# Patient Record
Sex: Male | Born: 1985 | Race: Black or African American | Hispanic: No | Marital: Married | Smoking: Current every day smoker
Health system: Southern US, Community
[De-identification: ages and names within clinical notes are randomized; demographics above are authoritative.]

## PROBLEM LIST (undated history)

## (undated) DIAGNOSIS — W3400XA Accidental discharge from unspecified firearms or gun, initial encounter: Secondary | ICD-10-CM

## (undated) DIAGNOSIS — F4310 Post-traumatic stress disorder, unspecified: Secondary | ICD-10-CM

## (undated) HISTORY — PX: OTHER SURGICAL HISTORY: SHX169

---
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
## 2008-11-05 DIAGNOSIS — W3400XA Accidental discharge from unspecified firearms or gun, initial encounter: Secondary | ICD-10-CM

## 2008-11-05 HISTORY — DX: Accidental discharge from unspecified firearms or gun, initial encounter: W34.00XA

## 2019-05-13 ENCOUNTER — Ambulatory Visit (HOSPITAL_COMMUNITY)
Admission: EM | Admit: 2019-05-13 | Discharge: 2019-05-13 | Disposition: A | Discharge status: Home / Self Care | Payer: Self-pay

## 2019-05-13 ENCOUNTER — Emergency Department (HOSPITAL_COMMUNITY)
Admission: EM | Admit: 2019-05-13 | Discharge: 2019-05-14 | Disposition: A | Discharge status: Home / Self Care | Payer: Self-pay | Attending: Emergency Medicine | Admitting: Emergency Medicine

## 2019-05-13 ENCOUNTER — Other Ambulatory Visit: Payer: Self-pay

## 2019-05-13 ENCOUNTER — Emergency Department (HOSPITAL_COMMUNITY): Payer: Self-pay

## 2019-05-13 ENCOUNTER — Encounter (HOSPITAL_COMMUNITY): Payer: Self-pay

## 2019-05-13 DIAGNOSIS — M25472 Effusion, left ankle: Secondary | ICD-10-CM | POA: Insufficient documentation

## 2019-05-13 DIAGNOSIS — M25471 Effusion, right ankle: Secondary | ICD-10-CM | POA: Insufficient documentation

## 2019-05-13 LAB — URINALYSIS, ROUTINE W REFLEX MICROSCOPIC
Bilirubin Urine: NEGATIVE
Glucose, UA: NEGATIVE mg/dL
Hgb urine dipstick: NEGATIVE
Ketones, ur: NEGATIVE mg/dL
Leukocytes,Ua: NEGATIVE
Nitrite: NEGATIVE
Protein, ur: NEGATIVE mg/dL
Specific Gravity, Urine: 1.024 (ref 1.005–1.030)
pH: 6 (ref 5.0–8.0)

## 2019-05-13 LAB — I-STAT CHEM 8, ED
BUN: 13 mg/dL (ref 6–20)
Calcium, Ion: 1.28 mmol/L (ref 1.15–1.40)
Chloride: 107 mmol/L (ref 98–111)
Creatinine, Ser: 1 mg/dL (ref 0.61–1.24)
Glucose, Bld: 86 mg/dL (ref 70–99)
HCT: 40 % (ref 39.0–52.0)
Hemoglobin: 13.6 g/dL (ref 13.0–17.0)
Potassium: 3.8 mmol/L (ref 3.5–5.1)
Sodium: 140 mmol/L (ref 135–145)
TCO2: 29 mmol/L (ref 22–32)
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
## 2019-05-13 NOTE — ED Notes (Signed)
No answer when called for room 

## 2019-05-13 NOTE — ED Notes (Signed)
Annye English Greeter) reported patient had out of state insurance and would have to pay for visit.  Patient did not want to do that and went to ed to be evaluated.

## 2019-05-13 NOTE — Discharge Instructions (Signed)
Your lab results today were reassuring. Be sure to eat a balanced diet, reducing salt, processed foods, fast food, and sugars. Follow-up with a primary care provider for any further management of this issue. Return to the ED for chest pain, shortness of breath, one-sided leg pain with swelling, difficulty breathing when lying flat, or any other major concerns.
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
## 2019-07-08 ENCOUNTER — Other Ambulatory Visit: Payer: Self-pay

## 2019-07-08 ENCOUNTER — Emergency Department (HOSPITAL_COMMUNITY)
Admission: EM | Admit: 2019-07-08 | Discharge: 2019-07-08 | Disposition: A | Discharge status: Home / Self Care | Payer: HRSA Program | Attending: Emergency Medicine | Admitting: Emergency Medicine

## 2019-07-08 ENCOUNTER — Encounter (HOSPITAL_COMMUNITY): Payer: Self-pay | Admitting: *Deleted

## 2019-07-08 DIAGNOSIS — Z20828 Contact with and (suspected) exposure to other viral communicable diseases: Secondary | ICD-10-CM | POA: Diagnosis not present

## 2019-07-08 DIAGNOSIS — Z20822 Contact with and (suspected) exposure to covid-19: Secondary | ICD-10-CM

## 2019-07-08 NOTE — ED Notes (Signed)
See EDP assessment. Pt verbalizes understanding of d/c instructions. Pt ambulatory at d/c with all belongings and with family.   

## 2019-07-08 NOTE — ED Triage Notes (Signed)
Pt reports close relative being covid + and he wants to be tested. Denies any symptoms.

## 2019-07-08 NOTE — Discharge Instructions (Signed)
It was my pleasure taking care of you today!  ° °You were tested for coronavirus.  You will be notified if your results are positive. °
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
## 2019-07-10 LAB — NOVEL CORONAVIRUS, NAA (HOSP ORDER, SEND-OUT TO REF LAB; TAT 18-24 HRS): SARS-CoV-2, NAA: NOT DETECTED

## 2019-07-13 ENCOUNTER — Emergency Department (HOSPITAL_COMMUNITY): Payer: Self-pay

## 2019-07-13 ENCOUNTER — Emergency Department (HOSPITAL_COMMUNITY)
Admission: EM | Admit: 2019-07-13 | Discharge: 2019-07-14 | Disposition: A | Discharge status: Home / Self Care | Payer: Self-pay | Attending: Emergency Medicine | Admitting: Emergency Medicine

## 2019-07-13 ENCOUNTER — Other Ambulatory Visit: Payer: Self-pay

## 2019-07-13 DIAGNOSIS — S62232A Other displaced fracture of base of first metacarpal bone, left hand, initial encounter for closed fracture: Secondary | ICD-10-CM | POA: Insufficient documentation

## 2019-07-13 DIAGNOSIS — Y999 Unspecified external cause status: Secondary | ICD-10-CM | POA: Insufficient documentation

## 2019-07-13 DIAGNOSIS — W11XXXA Fall on and from ladder, initial encounter: Secondary | ICD-10-CM | POA: Insufficient documentation

## 2019-07-13 DIAGNOSIS — Y929 Unspecified place or not applicable: Secondary | ICD-10-CM | POA: Insufficient documentation

## 2019-07-13 DIAGNOSIS — Y9389 Activity, other specified: Secondary | ICD-10-CM | POA: Insufficient documentation

## 2019-07-13 MED ORDER — IBUPROFEN 400 MG PO TABS
600.0000 mg | ORAL_TABLET | Freq: Once | ORAL | Status: AC
  Administered 2019-07-13: 600 mg via ORAL
  Filled 2019-07-13: qty 1

## 2019-07-13 NOTE — ED Triage Notes (Signed)
Pt fell off ladder trying to clean vents at [place] yesterday. Hand has become swollen and painful.
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
## 2019-07-14 MED ORDER — TRAMADOL HCL 50 MG PO TABS: 50.0000 mg | ORAL_TABLET | Freq: Four times a day (QID) | ORAL | 0 refills | Status: DC | PRN

## 2019-07-14 NOTE — Discharge Instructions (Signed)
Take pain medication as prescribed.  Follow-up with orthopedist as discussed--need to be seen within 1 week.  Wear arm splint continuously until you are seen by orthopedic surgeon.

## 2019-07-15 ENCOUNTER — Ambulatory Visit (INDEPENDENT_AMBULATORY_CARE_PROVIDER_SITE_OTHER): Payer: Self-pay | Admitting: Orthopaedic Surgery

## 2019-07-15 VITALS — Ht 62.0 in | Wt 190.0 lb

## 2019-07-15 DIAGNOSIS — S62212A Bennett's fracture, left hand, initial encounter for closed fracture: Secondary | ICD-10-CM
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
## 2019-07-17 ENCOUNTER — Other Ambulatory Visit: Payer: Self-pay

## 2019-07-17 ENCOUNTER — Encounter (HOSPITAL_COMMUNITY): Payer: Self-pay | Admitting: *Deleted

## 2019-07-17 NOTE — Pre-Procedure Instructions (Signed)
    Jay Weaver  07/17/2019      Your procedure is scheduled on Monday, September 14.  Report to [facility], Main Entrance or Entrance "A" at 2:30 PM  Call this number if you have problems the morning of surgery: [phone]  This is the number for the Pre- Surgical Desk.    Remember:  Do not eat  after midnight, Sunday, September 13.  You may drink clear liquids until 2:00 PM.  Clear liquids allowed are:                    Water, Juice (non-citric and without pulp), Carbonated beverages, Clear Tea, Black Coffee only, Plain Jell-O only, Gatorade and Plain Popsicles only  Drink the Pre- Surgery Ensure between 1:30PM and 2:00 PM    Take these medicines the morning of surgery with A SIP OF WATER:Tramadol if needed  Shower, wear clean clothes, brush your teeth.  Do not wear jewelry, make-up or nail polish.  Do not wear lotions, powders, or perfumes, or deodorant.  Do not shave 48 hours prior to surgery.  Men may shave face and neck.  Do not bring valuables to the hospital.  [facility] is not responsible for any belongings or valuables.

## 2019-07-17 NOTE — Progress Notes (Signed)
Jay Weaver denies chest pain or shortness of breath, patient had a negative Covid test done 07/14/2019 due to an exposure.  Patient is currently in [place] and will be home Sunday, 07/19/2019.  Jay Weaver knows to have Covid test done Monday AM. I instructed patient to not eat after midnight Sunday.  I told him he could drink clear liquids until 1400, I went over what clear liquids are. I told patient that I will try to get a Pre- Surgery Ensure to [facility] to be given to him Monday and I instructed him to drink the Ensure drink between 1330- 1400.

## 2019-07-20 ENCOUNTER — Ambulatory Visit (HOSPITAL_COMMUNITY)
Admission: RE | Admit: 2019-07-20 | Discharge: 2019-07-20 | Disposition: A | Discharge status: Home / Self Care | Payer: Self-pay | Attending: Orthopaedic Surgery | Admitting: Orthopaedic Surgery

## 2019-07-20 ENCOUNTER — Ambulatory Visit (HOSPITAL_COMMUNITY): Payer: Self-pay

## 2019-07-20 ENCOUNTER — Other Ambulatory Visit: Payer: Self-pay

## 2019-07-20 ENCOUNTER — Ambulatory Visit (HOSPITAL_COMMUNITY): Payer: Self-pay | Admitting: Certified Registered Nurse Anesthetist

## 2019-07-20 ENCOUNTER — Encounter (HOSPITAL_COMMUNITY): Payer: Self-pay | Admitting: *Deleted

## 2019-07-20 ENCOUNTER — Encounter (HOSPITAL_COMMUNITY)
Admission: RE | Disposition: A | Discharge status: Home / Self Care | Payer: Self-pay | Source: Home / Self Care | Attending: Orthopaedic Surgery

## 2019-07-20 DIAGNOSIS — X58XXXA Exposure to other specified factors, initial encounter: Secondary | ICD-10-CM | POA: Insufficient documentation

## 2019-07-20 DIAGNOSIS — S62202A Unspecified fracture of first metacarpal bone, left hand, initial encounter for closed fracture: Secondary | ICD-10-CM | POA: Insufficient documentation

## 2019-07-20 DIAGNOSIS — Z880 Allergy status to penicillin: Secondary | ICD-10-CM | POA: Insufficient documentation

## 2019-07-20 DIAGNOSIS — Z20828 Contact with and (suspected) exposure to other viral communicable diseases: Secondary | ICD-10-CM | POA: Insufficient documentation

## 2019-07-20 DIAGNOSIS — F17290 Nicotine dependence, other tobacco product, uncomplicated: Secondary | ICD-10-CM | POA: Insufficient documentation

## 2019-07-20 DIAGNOSIS — S62212A Bennett's fracture, left hand, initial encounter for closed fracture: Secondary | ICD-10-CM

## 2019-07-20 HISTORY — DX: Post-traumatic stress disorder, unspecified: F43.10

## 2019-07-20 HISTORY — PX: CLOSED REDUCTION FINGER WITH PERCUTANEOUS PINNING: SHX5612

## 2019-07-20 LAB — COMPREHENSIVE METABOLIC PANEL
ALT: 21 U/L (ref 0–44)
AST: 26 U/L (ref 15–41)
Albumin: 4.2 g/dL (ref 3.5–5.0)
Alkaline Phosphatase: 54 U/L (ref 38–126)
Anion gap: 8 (ref 5–15)
BUN: 9 mg/dL (ref 6–20)
CO2: 25 mmol/L (ref 22–32)
Calcium: 9.2 mg/dL (ref 8.9–10.3)
Chloride: 107 mmol/L (ref 98–111)
Creatinine, Ser: 1.05 mg/dL (ref 0.61–1.24)
GFR calc Af Amer: >60 mL/min (ref >60)
GFR calc non Af Amer: >60 mL/min (ref >60)
Glucose, Bld: 100 mg/dL — ABNORMAL HIGH (ref 70–99)
Potassium: 3.4 mmol/L — ABNORMAL LOW (ref 3.5–5.1)
Sodium: 140 mmol/L (ref 135–145)
Total Bilirubin: 0.4 mg/dL (ref 0.3–1.2)
Total Protein: 7.3 g/dL (ref 6.5–8.1)

## 2019-07-20 LAB — SARS CORONAVIRUS 2 BY RT PCR (HOSPITAL ORDER, PERFORMED IN ~~LOC~~ HOSPITAL LAB): SARS Coronavirus 2: NEGATIVE

## 2019-07-20 LAB — HEMOGLOBIN: Hemoglobin: 14.1 g/dL (ref 13.0–17.0)

## 2019-07-20 SURGERY — CLOSED REDUCTION, FINGER, WITH PERCUTANEOUS PINNING
Anesthesia: General | Site: Finger | Laterality: Left

## 2019-07-20 MED ORDER — FENTANYL CITRATE (PF) 100 MCG/2ML IJ SOLN
25.0000 ug | INTRAMUSCULAR | Status: DC | PRN
  Administered 2019-07-20: 50 ug via INTRAVENOUS
  Administered 2019-07-20: 25 ug via INTRAVENOUS

## 2019-07-20 MED ORDER — ONDANSETRON HCL 4 MG/2ML IJ SOLN
INTRAMUSCULAR | Status: AC
  Filled 2019-07-20: qty 2

## 2019-07-20 MED ORDER — HALOPERIDOL LACTATE 5 MG/ML IJ SOLN
2.5000 mg | INTRAMUSCULAR | Status: AC | PRN
  Administered 2019-07-20 (×2): 2.5 mg via INTRAVENOUS

## 2019-07-20 MED ORDER — ACETAMINOPHEN 500 MG PO TABS
1000.0000 mg | ORAL_TABLET | Freq: Once | ORAL | Status: AC
  Administered 2019-07-20: 1000 mg via ORAL

## 2019-07-20 MED ORDER — FENTANYL CITRATE (PF) 250 MCG/5ML IJ SOLN
INTRAMUSCULAR | Status: AC
  Filled 2019-07-20: qty 5

## 2019-07-20 MED ORDER — CHLORHEXIDINE GLUCONATE 4 % EX LIQD: 60.0000 mL | Freq: Once | CUTANEOUS | Status: DC

## 2019-07-20 MED ORDER — FENTANYL CITRATE (PF) 100 MCG/2ML IJ SOLN
INTRAMUSCULAR | Status: AC
  Filled 2019-07-20: qty 2

## 2019-07-20 MED ORDER — OXYCODONE-ACETAMINOPHEN 5-325 MG PO TABS: 1.0000 | ORAL_TABLET | Freq: Three times a day (TID) | ORAL | 0 refills | Status: DC | PRN

## 2019-07-20 MED ORDER — CLINDAMYCIN PHOSPHATE 900 MG/50ML IV SOLN
900.0000 mg | INTRAVENOUS | Status: AC
  Administered 2019-07-20: 900 mg via INTRAVENOUS
  Filled 2019-07-20: qty 50

## 2019-07-20 MED ORDER — MIDAZOLAM HCL 2 MG/2ML IJ SOLN
INTRAMUSCULAR | Status: AC
  Filled 2019-07-20: qty 2

## 2019-07-20 MED ORDER — ACETAMINOPHEN 500 MG PO TABS
ORAL_TABLET | ORAL | Status: AC
  Filled 2019-07-20: qty 2

## 2019-07-20 MED ORDER — BUPIVACAINE HCL (PF) 0.25 % IJ SOLN
INTRAMUSCULAR | Status: AC
  Filled 2019-07-20: qty 30

## 2019-07-20 MED ORDER — DEXMEDETOMIDINE HCL 200 MCG/2ML IV SOLN
INTRAVENOUS | Status: DC | PRN
  Administered 2019-07-20: 20 ug via INTRAVENOUS
  Administered 2019-07-20: 16 ug via INTRAVENOUS

## 2019-07-20 MED ORDER — HYDROMORPHONE HCL 1 MG/ML IJ SOLN: 0.2500 mg | INTRAMUSCULAR | Status: DC | PRN

## 2019-07-20 MED ORDER — DEXAMETHASONE SODIUM PHOSPHATE 10 MG/ML IJ SOLN
INTRAMUSCULAR | Status: AC
  Filled 2019-07-20: qty 1

## 2019-07-20 MED ORDER — MIDAZOLAM HCL 5 MG/5ML IJ SOLN
INTRAMUSCULAR | Status: DC | PRN
  Administered 2019-07-20: 2 mg via INTRAVENOUS

## 2019-07-20 MED ORDER — FENTANYL CITRATE (PF) 100 MCG/2ML IJ SOLN
INTRAMUSCULAR | Status: DC | PRN
  Administered 2019-07-20: 50 ug via INTRAVENOUS
  Administered 2019-07-20 (×3): 25 ug via INTRAVENOUS

## 2019-07-20 MED ORDER — ONDANSETRON HCL 4 MG PO TABS: 4.0000 mg | ORAL_TABLET | Freq: Three times a day (TID) | ORAL | 0 refills | Status: DC | PRN

## 2019-07-20 MED ORDER — PROPOFOL 10 MG/ML IV BOLUS
INTRAVENOUS | Status: AC
  Filled 2019-07-20: qty 20

## 2019-07-20 MED ORDER — LACTATED RINGERS IV SOLN
INTRAVENOUS | Status: DC
  Administered 2019-07-20: 12:00:00 via INTRAVENOUS

## 2019-07-20 MED ORDER — HALOPERIDOL LACTATE 5 MG/ML IJ SOLN
5.0000 mg | Freq: Once | INTRAMUSCULAR | Status: DC
  Filled 2019-07-20: qty 1

## 2019-07-20 MED ORDER — OXYCODONE HCL 5 MG PO TABS
ORAL_TABLET | ORAL | Status: AC
  Filled 2019-07-20: qty 2

## 2019-07-20 MED ORDER — LACTATED RINGERS IV SOLN: INTRAVENOUS | Status: DC

## 2019-07-20 MED ORDER — ONDANSETRON HCL 4 MG/2ML IJ SOLN
INTRAMUSCULAR | Status: DC | PRN
  Administered 2019-07-20: 4 mg via INTRAVENOUS

## 2019-07-20 MED ORDER — LIDOCAINE 2% (20 MG/ML) 5 ML SYRINGE
INTRAMUSCULAR | Status: DC | PRN
  Administered 2019-07-20: 80 mg via INTRAVENOUS

## 2019-07-20 MED ORDER — OXYCODONE HCL 5 MG PO TABS
10.0000 mg | ORAL_TABLET | Freq: Once | ORAL | Status: AC
  Administered 2019-07-20: 10 mg via ORAL

## 2019-07-20 MED ORDER — 0.9 % SODIUM CHLORIDE (POUR BTL) OPTIME
TOPICAL | Status: DC | PRN
  Administered 2019-07-20: 15:00:00 1000 mL

## 2019-07-20 MED ORDER — DEXAMETHASONE SODIUM PHOSPHATE 10 MG/ML IJ SOLN
INTRAMUSCULAR | Status: DC | PRN
  Administered 2019-07-20: 10 mg via INTRAVENOUS

## 2019-07-20 MED ORDER — BUPIVACAINE HCL (PF) 0.25 % IJ SOLN
INTRAMUSCULAR | Status: DC | PRN
  Administered 2019-07-20: 10 mL

## 2019-07-20 SURGICAL SUPPLY — 40 items
BANDAGE ELASTIC 3 VELCRO ST LF (GAUZE/BANDAGES/DRESSINGS) ×3 IMPLANT
BENZOIN TINCTURE PRP APPL 2/3 (GAUZE/BANDAGES/DRESSINGS) IMPLANT
BLADE CLIPPER SURG (BLADE) IMPLANT
BNDG ELASTIC 2 VLCR STRL LF (GAUZE/BANDAGES/DRESSINGS) ×3 IMPLANT
BNDG ELASTIC 3X5.8 VLCR STR LF (GAUZE/BANDAGES/DRESSINGS) ×3 IMPLANT
BNDG ELASTIC 4X5.8 VLCR STR LF (GAUZE/BANDAGES/DRESSINGS) IMPLANT
BNDG GAUZE ELAST 4 BULKY (GAUZE/BANDAGES/DRESSINGS) ×3 IMPLANT
CLOSURE WOUND 1/2 X4 (GAUZE/BANDAGES/DRESSINGS)
COVER SURGICAL LIGHT HANDLE (MISCELLANEOUS) ×3 IMPLANT
COVER WAND RF STERILE (DRAPES) ×3 IMPLANT
CUFF TOURN SGL QUICK 18X4 (TOURNIQUET CUFF) IMPLANT
CUFF TOURN SGL QUICK 24 (TOURNIQUET CUFF)
CUFF TRNQT CYL 24X4X16.5-23 (TOURNIQUET CUFF) IMPLANT
DURAPREP 26ML APPLICATOR (WOUND CARE) ×3 IMPLANT
ELECT CAUTERY BLADE 6.4 (BLADE) ×3 IMPLANT
FACESHIELD WRAPAROUND (MASK) IMPLANT
GAUZE SPONGE 4X4 12PLY STRL (GAUZE/BANDAGES/DRESSINGS) ×3 IMPLANT
GAUZE XEROFORM 1X8 LF (GAUZE/BANDAGES/DRESSINGS) ×3 IMPLANT
GLOVE BIOGEL PI IND STRL 7.0 (GLOVE) ×1 IMPLANT
GLOVE BIOGEL PI INDICATOR 7.0 (GLOVE) ×2
GLOVE ECLIPSE 7.0 STRL STRAW (GLOVE) ×3 IMPLANT
GLOVE SS BIOGEL STRL SZ 7.5 (GLOVE) ×2 IMPLANT
GLOVE SUPERSENSE BIOGEL SZ 7.5 (GLOVE) ×4
GOWN STRL REIN XL XLG (GOWN DISPOSABLE) ×9 IMPLANT
K-WIRE .045 CH (WIRE) ×6
KIT BASIN OR (CUSTOM PROCEDURE TRAY) ×3 IMPLANT
KIT TURNOVER KIT B (KITS) ×3 IMPLANT
KWIRE .045 CH (WIRE) ×2 IMPLANT
NS IRRIG 1000ML POUR BTL (IV SOLUTION) ×3 IMPLANT
PACK ORTHO EXTREMITY (CUSTOM PROCEDURE TRAY) ×3 IMPLANT
PAD ARMBOARD 7.5X6 YLW CONV (MISCELLANEOUS) ×6 IMPLANT
PAD CAST 3X4 CTTN HI CHSV (CAST SUPPLIES) ×1 IMPLANT
PADDING CAST COTTON 3X4 STRL (CAST SUPPLIES) ×2
STRIP CLOSURE SKIN 1/2X4 (GAUZE/BANDAGES/DRESSINGS) IMPLANT
SUT ETHILON 4 0 P 3 18 (SUTURE) IMPLANT
SUT PROLENE 4 0 P 3 18 (SUTURE) IMPLANT
TOWEL GREEN STERILE (TOWEL DISPOSABLE) ×3 IMPLANT
TOWEL GREEN STERILE FF (TOWEL DISPOSABLE) ×3 IMPLANT
UNDERPAD 30X30 (UNDERPADS AND DIAPERS) ×3 IMPLANT
WATER STERILE IRR 1000ML POUR (IV SOLUTION) ×3 IMPLANT

## 2019-07-20 NOTE — Anesthesia Procedure Notes (Signed)
Procedure Name: LMA Insertion Date/Time: 07/20/2019 1:57 PM Performed by: Genelle Bal, CRNA Pre-anesthesia Checklist: Patient identified, Emergency Drugs available, Suction available and Patient being monitored Patient Re-evaluated:Patient Re-evaluated prior to induction Oxygen Delivery Method: Circle system utilized Preoxygenation: Pre-oxygenation with 100% oxygen Induction Type: IV induction Ventilation: Mask ventilation without difficulty LMA: LMA inserted LMA Size: 4.0 Number of attempts: 1 Airway Equipment and Method: Bite block Placement Confirmation: positive ETCO2 Tube secured with: Tape Dental Injury: Teeth and Oropharynx as per pre-operative assessment
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
## 2019-07-20 NOTE — Op Note (Signed)
   Date of Surgery: 07/20/2019  INDICATIONS: Mr. Brimage is a 33 y.o.-year-old male with a left thumb metacarpal fracture;  The patient did consent to the procedure after discussion of the risks and benefits.  PREOPERATIVE DIAGNOSIS: Left thumb metacarpal fracture, Bennett's  POSTOPERATIVE DIAGNOSIS: Same.  PROCEDURE: Open reduction internal fixation of left thumb metacarpal Bennett's fracture  SURGEON: N. Eduard Roux, M.D.  ASSIST: Ciro Backer [place], [place]; necessary for the timely completion of procedure and due to complexity of procedure.  ANESTHESIA:  general  IV FLUIDS AND URINE: See anesthesia.  ESTIMATED BLOOD LOSS: minimal mL.  IMPLANTS: 0.045 K wires x 2  DRAINS: none  COMPLICATIONS: see description of procedure.  DESCRIPTION OF PROCEDURE: The patient was brought to the operating room and placed supine on the operating table.  The patient had been signed prior to the procedure and this was documented. The patient had the anesthesia placed by the anesthesiologist.  A time-out was performed to confirm that this was the correct patient, site, side and location. The patient did receive antibiotics prior to the incision and was re-dosed during the procedure as needed at indicated intervals.  A tourniquet was placed.  The patient had the operative extremity prepped and draped in the standard surgical fashion.    After failed attempts at closed reduction of the fracture by using traction and rotation and percutaneous reduction using a tenaculum clamp I made an incision over the thumb metacarpal.  Dissection was carried down through the subcutaneous tissue.  Branches of the superficial radial nerve were identified and mobilized.  The interval between the EPB and APL tendon was used.  The tendons were retracted.  Periosteal elevation was performed off of the thumb metacarpal base.  I then used a tenaculum clamp to achieve an anatomic reduction of the fracture as well as using traction  and rotation.  With the fracture reduced and confirmed under fluoroscopy to K wires were advanced from the base of the thumb metacarpal into the second metacarpal and one from the thumb metacarpal into the trapezium.  The clamp was removed and the fracture stayed reduced.  The surgical wound was then thoroughly irrigated and the skin was closed with interrupted 3-0 nylon sutures.  The pins were bent and cut short and padded against the skin.  Sterile dressings were applied.  Thumb spica splint was placed.  Patient tolerated the procedure well had no immediate complications.  POSTOPERATIVE PLAN: Discharge home and follow-up in 2 weeks for suture removal and repeat x-rays.  Azucena Cecil, MD 2:54 PM

## 2019-07-20 NOTE — Anesthesia Postprocedure Evaluation (Signed)
Anesthesia Post Note  Patient: Keyon Liller  Procedure(s) Performed: CLOSED REDUCTION LEFT THUMB CARPOMETACARPAL FRACTURE WITH PERCUTANEOUS PINNING (Left Finger)     Patient location during evaluation: PACU Anesthesia Type: General Level of consciousness: awake and alert and oriented Pain management: pain level controlled Vital Signs Assessment: post-procedure vital signs reviewed and stable Respiratory status: spontaneous breathing, nonlabored ventilation and respiratory function stable Cardiovascular status: blood pressure returned to baseline Postop Assessment: no apparent nausea or vomiting Anesthetic complications: no    Last Vitals:  Vitals:   07/20/19 1645 07/20/19 1700  BP: (!) 137/99 (!) 145/94  Pulse: 65 75  Resp: 18 17  Temp:  (!) 36.4 C  SpO2: 97% 97%    Last Pain:  Vitals:   07/20/19 1700  TempSrc:   PainSc: [place]

## 2019-07-20 NOTE — Anesthesia Preprocedure Evaluation (Addendum)
Anesthesia Evaluation  Patient identified by MRN, date of birth, ID band Patient awake    Reviewed: Allergy & Precautions, H&P , NPO status , Patient's Chart, lab work & pertinent test results  Airway Mallampati: II  TM Distance: >3 FB Neck ROM: Full    Dental no notable dental hx. (+) Partial Lower, Partial Upper, Dental Advisory Given   Pulmonary Current Smoker and Patient abstained from smoking.,    Pulmonary exam normal breath sounds clear to auscultation       Cardiovascular negative cardio ROS   Rhythm:Regular Rate:Normal     Neuro/Psych Anxiety negative neurological ROS     GI/Hepatic negative GI ROS, Neg liver ROS,   Endo/Other  negative endocrine ROS  Renal/GU negative Renal ROS  negative genitourinary   Musculoskeletal   Abdominal   Peds  Hematology negative hematology ROS (+)   Anesthesia Other Findings   Reproductive/Obstetrics negative OB ROS                            Anesthesia Physical Anesthesia Plan  ASA: II  Anesthesia Plan: General   Post-op Pain Management:    Induction: Intravenous  PONV Risk Score and Plan: 2 and Ondansetron, Dexamethasone and Midazolam  Airway Management Planned: LMA  Additional Equipment:   Intra-op Plan:   Post-operative Plan: Extubation in OR  Informed Consent: I have reviewed the patients History and Physical, chart, labs and discussed the procedure including the risks, benefits and alternatives for the proposed anesthesia with the patient or authorized representative who has indicated his/her understanding and acceptance.     Dental advisory given  Plan Discussed with: CRNA  Anesthesia Plan Comments:         Anesthesia Quick Evaluation

## 2019-07-20 NOTE — Transfer of Care (Signed)
Immediate Anesthesia Transfer of Care Note  Patient: Jay Weaver  Procedure(s) Performed: CLOSED REDUCTION LEFT THUMB CARPOMETACARPAL FRACTURE WITH PERCUTANEOUS PINNING (Left Finger)  Patient Location: PACU  Anesthesia Type:General  Level of Consciousness: pateint uncooperative  Airway & Oxygen Therapy: Patient Spontanous Breathing and Patient connected to nasal cannula oxygen  Post-op Assessment: Report given to RN, Post -op Vital signs reviewed and stable and Patient moving all extremities  Post vital signs: Reviewed and stable  Last Vitals:  Vitals Value Taken Time  BP 140/103 07/20/19 1545  Temp 36.4 C 07/20/19 1530  Pulse 73 07/20/19 1554  Resp 16 07/20/19 1554  SpO2 97 % 07/20/19 1554  Vitals shown include unvalidated device data.  Last Pain:  Vitals:   07/20/19 1530  TempSrc:   PainSc: Asleep         Complications: No apparent anesthesia complications

## 2019-07-20 NOTE — Discharge Instructions (Signed)

## 2019-07-21 ENCOUNTER — Encounter (HOSPITAL_COMMUNITY): Payer: Self-pay | Admitting: Orthopaedic Surgery

## 2019-08-04 ENCOUNTER — Ambulatory Visit (INDEPENDENT_AMBULATORY_CARE_PROVIDER_SITE_OTHER): Payer: Self-pay

## 2019-08-04 ENCOUNTER — Ambulatory Visit (INDEPENDENT_AMBULATORY_CARE_PROVIDER_SITE_OTHER): Payer: Self-pay | Admitting: Orthopaedic Surgery

## 2019-08-04 ENCOUNTER — Encounter: Payer: Self-pay | Admitting: Orthopaedic Surgery

## 2019-08-04 DIAGNOSIS — S62212A Bennett's fracture, left hand, initial encounter for closed fracture: Secondary | ICD-10-CM
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
## 2019-08-18 ENCOUNTER — Ambulatory Visit (INDEPENDENT_AMBULATORY_CARE_PROVIDER_SITE_OTHER): Payer: Self-pay | Admitting: Orthopaedic Surgery

## 2019-08-18 ENCOUNTER — Ambulatory Visit (INDEPENDENT_AMBULATORY_CARE_PROVIDER_SITE_OTHER): Payer: Self-pay

## 2019-08-18 ENCOUNTER — Other Ambulatory Visit: Payer: Self-pay

## 2019-08-18 ENCOUNTER — Encounter: Payer: Self-pay | Admitting: Orthopaedic Surgery

## 2019-08-18 DIAGNOSIS — S62212A Bennett's fracture, left hand, initial encounter for closed fracture: Secondary | ICD-10-CM
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
## 2019-08-20 ENCOUNTER — Encounter (HOSPITAL_COMMUNITY): Payer: Self-pay

## 2019-08-20 ENCOUNTER — Ambulatory Visit (HOSPITAL_COMMUNITY)
Admission: EM | Admit: 2019-08-20 | Discharge: 2019-08-20 | Disposition: A | Discharge status: Home / Self Care | Payer: Self-pay | Attending: Urgent Care | Admitting: Urgent Care

## 2019-08-20 ENCOUNTER — Other Ambulatory Visit: Payer: Self-pay

## 2019-08-20 DIAGNOSIS — Z202 Contact with and (suspected) exposure to infections with a predominantly sexual mode of transmission: Secondary | ICD-10-CM | POA: Insufficient documentation

## 2019-08-20 MED ORDER — CEFTRIAXONE SODIUM 250 MG IJ SOLR
250.0000 mg | Freq: Once | INTRAMUSCULAR | Status: AC
  Administered 2019-08-20: 250 mg via INTRAMUSCULAR

## 2019-08-20 MED ORDER — AZITHROMYCIN 250 MG PO TABS
1000.0000 mg | ORAL_TABLET | Freq: Once | ORAL | Status: AC
  Administered 2019-08-20: 1000 mg via ORAL

## 2019-08-20 MED ORDER — AZITHROMYCIN 250 MG PO TABS
ORAL_TABLET | ORAL | Status: AC
  Filled 2019-08-20: qty 4

## 2019-08-20 MED ORDER — CEFTRIAXONE SODIUM 250 MG IJ SOLR
INTRAMUSCULAR | Status: AC
  Filled 2019-08-20: qty 250
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
## 2019-08-24 ENCOUNTER — Telehealth (HOSPITAL_COMMUNITY): Payer: Self-pay | Admitting: Emergency Medicine

## 2019-08-24 LAB — CYTOLOGY, (ORAL, ANAL, URETHRAL) ANCILLARY ONLY
Chlamydia: NEGATIVE
Neisseria Gonorrhea: NEGATIVE
Trichomonas: POSITIVE — AB

## 2019-08-24 MED ORDER — METRONIDAZOLE 500 MG PO TABS: 2000.0000 mg | ORAL_TABLET | Freq: Once | ORAL | 0 refills | Status: AC

## 2019-08-24 NOTE — Telephone Encounter (Signed)
Trichomonas is positive. Rx  for Flagyl 2 grams, once was sent to the pharmacy of record. Pt needs education to refrain from sexual intercourse for 7 days to give the medicine time to work. Sexual partners need to be notified and tested/treated. Condoms may reduce risk of reinfection. Recheck for further evaluation if symptoms are not improving.   Patient contacted and made aware of    results, all questions answered    

## 2019-08-27 ENCOUNTER — Telehealth: Payer: Self-pay | Admitting: Orthopaedic Surgery

## 2019-08-27 NOTE — Telephone Encounter (Signed)
Is note okay.

## 2019-08-27 NOTE — Telephone Encounter (Signed)
I called pt to reschedule his appt for 10/27 due to the move, pt said he needs a work note to keep him out of work till next appt which is 11/4.      [phone]

## 2019-08-27 NOTE — Telephone Encounter (Signed)
ok 

## 2019-09-01 ENCOUNTER — Ambulatory Visit: Payer: Self-pay | Admitting: Orthopaedic Surgery

## 2019-09-02 NOTE — Telephone Encounter (Signed)
Tried calling no answer. Letter ready for pick up.

## 2019-09-09 ENCOUNTER — Ambulatory Visit (INDEPENDENT_AMBULATORY_CARE_PROVIDER_SITE_OTHER): Payer: Self-pay | Admitting: Orthopaedic Surgery

## 2019-09-09 ENCOUNTER — Ambulatory Visit (INDEPENDENT_AMBULATORY_CARE_PROVIDER_SITE_OTHER): Payer: Self-pay

## 2019-09-09 ENCOUNTER — Encounter: Payer: Self-pay | Admitting: Orthopaedic Surgery

## 2019-09-09 VITALS — Ht 62.0 in | Wt 190.0 lb

## 2019-09-09 DIAGNOSIS — S62212A Bennett's fracture, left hand, initial encounter for closed fracture: Secondary | ICD-10-CM
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
## 2019-10-14 ENCOUNTER — Ambulatory Visit: Payer: Medicaid Other | Admitting: Orthopaedic Surgery

## 2019-11-21 ENCOUNTER — Other Ambulatory Visit: Payer: Self-pay

## 2019-11-21 ENCOUNTER — Encounter (HOSPITAL_BASED_OUTPATIENT_CLINIC_OR_DEPARTMENT_OTHER): Payer: Self-pay

## 2019-11-21 ENCOUNTER — Emergency Department (HOSPITAL_BASED_OUTPATIENT_CLINIC_OR_DEPARTMENT_OTHER)
Admission: EM | Admit: 2019-11-21 | Discharge: 2019-11-21 | Disposition: A | Discharge status: Home / Self Care | Payer: Medicaid Other | Attending: Emergency Medicine | Admitting: Emergency Medicine

## 2019-11-21 DIAGNOSIS — K0889 Other specified disorders of teeth and supporting structures: Secondary | ICD-10-CM | POA: Insufficient documentation

## 2019-11-21 DIAGNOSIS — F17210 Nicotine dependence, cigarettes, uncomplicated: Secondary | ICD-10-CM | POA: Insufficient documentation

## 2019-11-21 MED ORDER — CLINDAMYCIN HCL 150 MG PO CAPS
300.0000 mg | ORAL_CAPSULE | Freq: Once | ORAL | Status: AC
  Administered 2019-11-21: 300 mg via ORAL
  Filled 2019-11-21: qty 2

## 2019-11-21 MED ORDER — CLINDAMYCIN HCL 300 MG PO CAPS: 300.0000 mg | ORAL_CAPSULE | Freq: Four times a day (QID) | ORAL | 0 refills | Status: DC

## 2019-11-21 MED ORDER — LIDOCAINE-EPINEPHRINE 2 %-1:100000 IJ SOLN
1.7000 mL | Freq: Once | INTRAMUSCULAR | Status: AC
  Administered 2019-11-21: 1.7 mL
  Filled 2019-11-21: qty 1.7

## 2019-11-21 MED ORDER — OXYCODONE-ACETAMINOPHEN 5-325 MG PO TABS: 1.0000 | ORAL_TABLET | Freq: Four times a day (QID) | ORAL | 0 refills | Status: DC | PRN

## 2019-11-21 MED ORDER — NAPROXEN 250 MG PO TABS
500.0000 mg | ORAL_TABLET | Freq: Once | ORAL | Status: AC
  Administered 2019-11-21: 02:00:00 500 mg via ORAL
  Filled 2019-11-21: qty 2

## 2019-11-21 NOTE — ED Triage Notes (Signed)
Pt c/o dental pain x 1 day. No swelling appreciated. Pt denies fevers.
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
## 2019-11-25 ENCOUNTER — Encounter (HOSPITAL_BASED_OUTPATIENT_CLINIC_OR_DEPARTMENT_OTHER): Payer: Self-pay | Admitting: Emergency Medicine

## 2019-11-25 ENCOUNTER — Emergency Department (HOSPITAL_BASED_OUTPATIENT_CLINIC_OR_DEPARTMENT_OTHER)
Admission: EM | Admit: 2019-11-25 | Discharge: 2019-11-25 | Disposition: A | Discharge status: Home / Self Care | Payer: Self-pay | Attending: Emergency Medicine | Admitting: Emergency Medicine

## 2019-11-25 ENCOUNTER — Emergency Department (HOSPITAL_BASED_OUTPATIENT_CLINIC_OR_DEPARTMENT_OTHER): Payer: Self-pay

## 2019-11-25 ENCOUNTER — Other Ambulatory Visit: Payer: Self-pay

## 2019-11-25 DIAGNOSIS — K0889 Other specified disorders of teeth and supporting structures: Secondary | ICD-10-CM | POA: Insufficient documentation

## 2019-11-25 DIAGNOSIS — F17210 Nicotine dependence, cigarettes, uncomplicated: Secondary | ICD-10-CM | POA: Insufficient documentation

## 2019-11-25 DIAGNOSIS — Z880 Allergy status to penicillin: Secondary | ICD-10-CM | POA: Insufficient documentation

## 2019-11-25 DIAGNOSIS — M79642 Pain in left hand: Secondary | ICD-10-CM | POA: Insufficient documentation

## 2019-11-25 MED ORDER — LIDOCAINE-EPINEPHRINE 2 %-1:100000 IJ SOLN
1.7000 mL | Freq: Once | INTRAMUSCULAR | Status: AC
  Administered 2019-11-25: 1.7 mL
  Filled 2019-11-25: qty 1.7

## 2019-11-25 MED ORDER — OXYCODONE-ACETAMINOPHEN 5-325 MG PO TABS: 1.0000 | ORAL_TABLET | Freq: Four times a day (QID) | ORAL | 0 refills | Status: DC | PRN

## 2019-11-25 MED ORDER — LIDOCAINE-EPINEPHRINE (PF) 2 %-1:200000 IJ SOLN
INTRAMUSCULAR | Status: AC
  Filled 2019-11-25: qty 10

## 2019-11-25 NOTE — ED Triage Notes (Signed)
Patient arrived via POV c/o dental pain x 1 week. Patient additionally c/o left hand pain x 2.5 months post surgery. Patient is AO x 4, VS WDL, normal gait.
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
## 2019-12-25 ENCOUNTER — Other Ambulatory Visit: Payer: Self-pay

## 2019-12-25 ENCOUNTER — Emergency Department (HOSPITAL_BASED_OUTPATIENT_CLINIC_OR_DEPARTMENT_OTHER): Payer: Self-pay

## 2019-12-25 ENCOUNTER — Encounter (HOSPITAL_BASED_OUTPATIENT_CLINIC_OR_DEPARTMENT_OTHER): Payer: Self-pay | Admitting: Emergency Medicine

## 2019-12-25 ENCOUNTER — Emergency Department (HOSPITAL_BASED_OUTPATIENT_CLINIC_OR_DEPARTMENT_OTHER)
Admission: EM | Admit: 2019-12-25 | Discharge: 2019-12-25 | Disposition: A | Discharge status: Home / Self Care | Payer: Self-pay | Attending: Emergency Medicine | Admitting: Emergency Medicine

## 2019-12-25 DIAGNOSIS — M79642 Pain in left hand: Secondary | ICD-10-CM | POA: Insufficient documentation

## 2019-12-25 DIAGNOSIS — F17290 Nicotine dependence, other tobacco product, uncomplicated: Secondary | ICD-10-CM | POA: Insufficient documentation

## 2019-12-25 MED ORDER — OXYCODONE-ACETAMINOPHEN 5-325 MG PO TABS: 1.0000 | ORAL_TABLET | Freq: Four times a day (QID) | ORAL | 0 refills | Status: DC | PRN

## 2019-12-25 MED ORDER — NAPROXEN 250 MG PO TABS
500.0000 mg | ORAL_TABLET | Freq: Once | ORAL | Status: AC
  Administered 2019-12-25: 500 mg via ORAL
  Filled 2019-12-25: qty 2
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
## 2019-12-25 NOTE — ED Triage Notes (Signed)
Patient arrived via POV c/o left hand pain x 5 hrs. Patient denies injury, states he was playing video games when he felt a pop. Patient took 400 mg ibuprofen at that time with no relief. Patient is AO x 4, VS WDL, normal gait.

## 2020-03-23 ENCOUNTER — Emergency Department (HOSPITAL_BASED_OUTPATIENT_CLINIC_OR_DEPARTMENT_OTHER): Payer: Medicaid Other

## 2020-03-23 ENCOUNTER — Emergency Department (HOSPITAL_BASED_OUTPATIENT_CLINIC_OR_DEPARTMENT_OTHER)
Admission: EM | Admit: 2020-03-23 | Discharge: 2020-03-23 | Disposition: A | Discharge status: Home / Self Care | Payer: Medicaid Other | Attending: Emergency Medicine | Admitting: Emergency Medicine

## 2020-03-23 ENCOUNTER — Encounter (HOSPITAL_BASED_OUTPATIENT_CLINIC_OR_DEPARTMENT_OTHER): Payer: Self-pay

## 2020-03-23 ENCOUNTER — Other Ambulatory Visit: Payer: Self-pay

## 2020-03-23 DIAGNOSIS — S63502A Unspecified sprain of left wrist, initial encounter: Secondary | ICD-10-CM | POA: Insufficient documentation

## 2020-03-23 DIAGNOSIS — S6992XA Unspecified injury of left wrist, hand and finger(s), initial encounter: Secondary | ICD-10-CM | POA: Diagnosis present

## 2020-03-23 DIAGNOSIS — X509XXA Other and unspecified overexertion or strenuous movements or postures, initial encounter: Secondary | ICD-10-CM | POA: Diagnosis not present

## 2020-03-23 DIAGNOSIS — F1210 Cannabis abuse, uncomplicated: Secondary | ICD-10-CM | POA: Diagnosis not present

## 2020-03-23 DIAGNOSIS — Y9389 Activity, other specified: Secondary | ICD-10-CM | POA: Diagnosis not present

## 2020-03-23 DIAGNOSIS — Y998 Other external cause status: Secondary | ICD-10-CM | POA: Insufficient documentation

## 2020-03-23 DIAGNOSIS — Y92019 Unspecified place in single-family (private) house as the place of occurrence of the external cause: Secondary | ICD-10-CM | POA: Diagnosis not present

## 2020-03-23 DIAGNOSIS — F17210 Nicotine dependence, cigarettes, uncomplicated: Secondary | ICD-10-CM | POA: Insufficient documentation

## 2020-03-23 DIAGNOSIS — F17290 Nicotine dependence, other tobacco product, uncomplicated: Secondary | ICD-10-CM | POA: Diagnosis not present

## 2020-03-23 MED ORDER — ACETAMINOPHEN 325 MG PO TABS
650.0000 mg | ORAL_TABLET | Freq: Once | ORAL | Status: AC
  Administered 2020-03-23: 650 mg via ORAL
  Filled 2020-03-23: qty 2

## 2020-03-23 MED ORDER — KETOROLAC TROMETHAMINE 15 MG/ML IJ SOLN
15.0000 mg | Freq: Once | INTRAMUSCULAR | Status: AC
  Administered 2020-03-23: 15 mg via INTRAMUSCULAR
  Filled 2020-03-23: qty 1

## 2020-03-23 NOTE — Discharge Instructions (Addendum)
At this time there does not appear to be the presence of an emergent medical condition, however there is always the potential for conditions to change. Please read and follow the below instructions.  Please return to the Emergency Department immediately for any new or worsening symptoms. Please be sure to follow up with your Primary Care Provider within one week regarding your visit today; please call their office to schedule an appointment even if you are feeling better for a follow-up visit. You have been given an NSAID-containing medication called Toradol today.  Do not take the medications including ibuprofen, Aleve, Advil, naproxen or other NSAID-containing medications for the next 2 days.  Please be sure to drink plenty of water over the next few days. Please use the wrist brace given to you today to help protect your small bones from further pain and injury.  Please call Dr. Roda Shutters on your discharge paperwork to schedule a follow-up appointment.  Use rest, ice and elevation to help with pain.  Get help right away if: You lose feeling in your fingers or hand. Your fingers turn white, very red, or cold and blue. You cannot move your fingers. You have a fever or chills. You have any new/concerning or worsening of symptoms  Please read the additional information packets attached to your discharge summary.  Do not take your medicine if  develop an itchy rash, swelling in your mouth or lips, or difficulty breathing; call 911 and seek immediate emergency medical attention if this occurs.  Note: Portions of this text may have been transcribed using voice recognition software. Every effort was made to ensure accuracy; however, inadvertent computerized transcription errors may still be present.

## 2020-03-23 NOTE — ED Notes (Signed)
Pt states he has two similar thumb spica splint at home. PA was called to the room to speak with PA.
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
## 2020-03-23 NOTE — ED Triage Notes (Signed)
Pt arrives with c/o pain to left wrist, had surgery to left wrist September 2020. States that he went to turn a door handle and his "wrist gave away and started hurting".

## 2020-06-13 DIAGNOSIS — F4312 Post-traumatic stress disorder, chronic: Secondary | ICD-10-CM | POA: Diagnosis not present

## 2020-06-13 DIAGNOSIS — F3110 Bipolar disorder, current episode manic without psychotic features, unspecified: Secondary | ICD-10-CM | POA: Diagnosis not present

## 2020-07-04 DIAGNOSIS — F3110 Bipolar disorder, current episode manic without psychotic features, unspecified: Secondary | ICD-10-CM | POA: Diagnosis not present

## 2020-07-04 DIAGNOSIS — K219 Gastro-esophageal reflux disease without esophagitis: Secondary | ICD-10-CM | POA: Diagnosis not present

## 2020-07-04 DIAGNOSIS — F4312 Post-traumatic stress disorder, chronic: Secondary | ICD-10-CM | POA: Diagnosis not present

## 2020-07-11 ENCOUNTER — Encounter (HOSPITAL_COMMUNITY): Payer: Self-pay | Admitting: Emergency Medicine

## 2020-07-11 ENCOUNTER — Emergency Department (HOSPITAL_COMMUNITY)
Admission: EM | Admit: 2020-07-11 | Discharge: 2020-07-11 | Disposition: A | Discharge status: Home / Self Care | Payer: Medicaid Other | Attending: Emergency Medicine | Admitting: Emergency Medicine

## 2020-07-11 DIAGNOSIS — M25511 Pain in right shoulder: Secondary | ICD-10-CM | POA: Insufficient documentation

## 2020-07-11 DIAGNOSIS — Y939 Activity, unspecified: Secondary | ICD-10-CM | POA: Insufficient documentation

## 2020-07-11 DIAGNOSIS — M25512 Pain in left shoulder: Secondary | ICD-10-CM | POA: Diagnosis not present

## 2020-07-11 DIAGNOSIS — Y929 Unspecified place or not applicable: Secondary | ICD-10-CM | POA: Insufficient documentation

## 2020-07-11 DIAGNOSIS — Y32XXXA Crashing of motor vehicle, undetermined intent, initial encounter: Secondary | ICD-10-CM | POA: Insufficient documentation

## 2020-07-11 DIAGNOSIS — Z5321 Procedure and treatment not carried out due to patient leaving prior to being seen by health care provider: Secondary | ICD-10-CM | POA: Insufficient documentation

## 2020-07-11 DIAGNOSIS — Y999 Unspecified external cause status: Secondary | ICD-10-CM | POA: Insufficient documentation
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
## 2020-07-11 NOTE — ED Notes (Signed)
Called Pt for Vitals recheck and no answer

## 2020-09-02 DIAGNOSIS — F4312 Post-traumatic stress disorder, chronic: Secondary | ICD-10-CM | POA: Diagnosis not present

## 2020-09-21 DIAGNOSIS — F4312 Post-traumatic stress disorder, chronic: Secondary | ICD-10-CM | POA: Diagnosis not present

## 2020-10-03 ENCOUNTER — Emergency Department (HOSPITAL_COMMUNITY)
Admission: EM | Admit: 2020-10-03 | Discharge: 2020-10-03 | Disposition: A | Discharge status: Home / Self Care | Payer: Medicaid Other | Attending: Emergency Medicine | Admitting: Emergency Medicine

## 2020-10-03 ENCOUNTER — Emergency Department (HOSPITAL_COMMUNITY): Payer: Medicaid Other

## 2020-10-03 ENCOUNTER — Other Ambulatory Visit: Payer: Self-pay

## 2020-10-03 ENCOUNTER — Encounter (HOSPITAL_COMMUNITY): Payer: Self-pay | Admitting: Emergency Medicine

## 2020-10-03 DIAGNOSIS — W51XXXA Accidental striking against or bumped into by another person, initial encounter: Secondary | ICD-10-CM | POA: Diagnosis not present

## 2020-10-03 DIAGNOSIS — F1210 Cannabis abuse, uncomplicated: Secondary | ICD-10-CM | POA: Insufficient documentation

## 2020-10-03 DIAGNOSIS — S46911A Strain of unspecified muscle, fascia and tendon at shoulder and upper arm level, right arm, initial encounter: Secondary | ICD-10-CM | POA: Diagnosis not present

## 2020-10-03 DIAGNOSIS — R0789 Other chest pain: Secondary | ICD-10-CM | POA: Diagnosis not present

## 2020-10-03 DIAGNOSIS — S4991XA Unspecified injury of right shoulder and upper arm, initial encounter: Secondary | ICD-10-CM | POA: Diagnosis present

## 2020-10-03 DIAGNOSIS — F17210 Nicotine dependence, cigarettes, uncomplicated: Secondary | ICD-10-CM | POA: Insufficient documentation

## 2020-10-03 NOTE — ED Triage Notes (Signed)
Pt c/o R shoulder injury x 1 week while boxing and lump on sternum. Pt denies pain at this time

## 2020-10-03 NOTE — ED Notes (Signed)
Patient transported to X-ray 

## 2020-10-03 NOTE — Discharge Instructions (Signed)
Your xray was normal.  Try to rest the area.  Follow up with a family doc in the office.   Take 4 over the counter ibuprofen tablets 3 times a day or 2 over-the-counter naproxen tablets twice a day for pain. Also take tylenol 1000mg (2 extra strength) four times a day.
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
## 2020-10-19 DIAGNOSIS — F4312 Post-traumatic stress disorder, chronic: Secondary | ICD-10-CM | POA: Diagnosis not present

## 2020-10-24 DIAGNOSIS — F4312 Post-traumatic stress disorder, chronic: Secondary | ICD-10-CM | POA: Diagnosis not present

## 2020-10-24 DIAGNOSIS — F3110 Bipolar disorder, current episode manic without psychotic features, unspecified: Secondary | ICD-10-CM | POA: Diagnosis not present

## 2020-12-01 DIAGNOSIS — F3110 Bipolar disorder, current episode manic without psychotic features, unspecified: Secondary | ICD-10-CM | POA: Diagnosis not present

## 2021-01-02 DIAGNOSIS — F3110 Bipolar disorder, current episode manic without psychotic features, unspecified: Secondary | ICD-10-CM | POA: Diagnosis not present

## 2021-01-02 DIAGNOSIS — F4312 Post-traumatic stress disorder, chronic: Secondary | ICD-10-CM | POA: Diagnosis not present
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
## 2021-01-15 ENCOUNTER — Emergency Department (HOSPITAL_BASED_OUTPATIENT_CLINIC_OR_DEPARTMENT_OTHER)
Admission: EM | Admit: 2021-01-15 | Discharge: 2021-01-15 | Disposition: A | Discharge status: Home / Self Care | Payer: No Typology Code available for payment source | Attending: Emergency Medicine | Admitting: Emergency Medicine

## 2021-01-15 ENCOUNTER — Other Ambulatory Visit: Payer: Self-pay

## 2021-01-15 ENCOUNTER — Encounter (HOSPITAL_BASED_OUTPATIENT_CLINIC_OR_DEPARTMENT_OTHER): Payer: Self-pay | Admitting: Emergency Medicine

## 2021-01-15 ENCOUNTER — Emergency Department (HOSPITAL_BASED_OUTPATIENT_CLINIC_OR_DEPARTMENT_OTHER): Payer: No Typology Code available for payment source

## 2021-01-15 DIAGNOSIS — M25511 Pain in right shoulder: Secondary | ICD-10-CM | POA: Diagnosis not present

## 2021-01-15 DIAGNOSIS — M549 Dorsalgia, unspecified: Secondary | ICD-10-CM | POA: Diagnosis not present

## 2021-01-15 DIAGNOSIS — M546 Pain in thoracic spine: Secondary | ICD-10-CM | POA: Diagnosis not present

## 2021-01-15 DIAGNOSIS — M545 Low back pain, unspecified: Secondary | ICD-10-CM | POA: Diagnosis not present

## 2021-01-15 DIAGNOSIS — F17210 Nicotine dependence, cigarettes, uncomplicated: Secondary | ICD-10-CM | POA: Insufficient documentation

## 2021-01-15 DIAGNOSIS — Y92410 Unspecified street and highway as the place of occurrence of the external cause: Secondary | ICD-10-CM | POA: Insufficient documentation

## 2021-01-15 DIAGNOSIS — M542 Cervicalgia: Secondary | ICD-10-CM | POA: Insufficient documentation

## 2021-01-15 DIAGNOSIS — I10 Essential (primary) hypertension: Secondary | ICD-10-CM | POA: Diagnosis not present

## 2021-01-15 DIAGNOSIS — Z041 Encounter for examination and observation following transport accident: Secondary | ICD-10-CM | POA: Diagnosis not present

## 2021-01-15 LAB — CBC WITH DIFFERENTIAL/PLATELET
Abs Immature Granulocytes: 0.03 10*3/uL (ref 0.00–0.07)
Basophils Absolute: 0.1 10*3/uL (ref 0.0–0.1)
Basophils Relative: 1 %
Eosinophils Absolute: 0.1 10*3/uL (ref 0.0–0.5)
Eosinophils Relative: 1 %
HCT: 41.1 % (ref 39.0–52.0)
Hemoglobin: 14.4 g/dL (ref 13.0–17.0)
Immature Granulocytes: 0 %
Lymphocytes Relative: 36 %
Lymphs Abs: 3.4 10*3/uL (ref 0.7–4.0)
MCH: 31 pg (ref 26.0–34.0)
MCHC: 35 g/dL (ref 30.0–36.0)
MCV: 88.6 fL (ref 80.0–100.0)
Monocytes Absolute: 0.7 10*3/uL (ref 0.1–1.0)
Monocytes Relative: 8 %
Neutro Abs: 5.1 10*3/uL (ref 1.7–7.7)
Neutrophils Relative %: 54 %
Platelets: 255 10*3/uL (ref 150–400)
RBC: 4.64 MIL/uL (ref 4.22–5.81)
RDW: 14.4 % (ref 11.5–15.5)
WBC: 9.4 10*3/uL (ref 4.0–10.5)
nRBC: 0 % (ref 0.0–0.2)

## 2021-01-15 LAB — COMPREHENSIVE METABOLIC PANEL
ALT: 25 U/L (ref 0–44)
AST: 25 U/L (ref 15–41)
Albumin: 4.6 g/dL (ref 3.5–5.0)
Alkaline Phosphatase: 63 U/L (ref 38–126)
Anion gap: 10 (ref 5–15)
BUN: 10 mg/dL (ref 6–20)
CO2: 25 mmol/L (ref 22–32)
Calcium: 9.8 mg/dL (ref 8.9–10.3)
Chloride: 103 mmol/L (ref 98–111)
Creatinine, Ser: 1.24 mg/dL (ref 0.61–1.24)
GFR, Estimated: >60 mL/min (ref >60)
Glucose, Bld: 97 mg/dL (ref 70–99)
Potassium: 3.4 mmol/L — ABNORMAL LOW (ref 3.5–5.1)
Sodium: 138 mmol/L (ref 135–145)
Total Bilirubin: 0.2 mg/dL — ABNORMAL LOW (ref 0.3–1.2)
Total Protein: 8 g/dL (ref 6.5–8.1)

## 2021-01-15 MED ORDER — FENTANYL CITRATE (PF) 100 MCG/2ML IJ SOLN
50.0000 ug | INTRAMUSCULAR | Status: DC | PRN
  Administered 2021-01-15: 50 ug via NASAL
  Filled 2021-01-15 (×2): qty 2

## 2021-01-15 MED ORDER — IBUPROFEN 800 MG PO TABS: 800.0000 mg | ORAL_TABLET | Freq: Three times a day (TID) | ORAL | 0 refills | Status: DC

## 2021-01-15 MED ORDER — ONDANSETRON HCL 4 MG/2ML IJ SOLN
4.0000 mg | Freq: Once | INTRAMUSCULAR | Status: AC
  Administered 2021-01-15: 4 mg via INTRAVENOUS
  Filled 2021-01-15: qty 2

## 2021-01-15 MED ORDER — MORPHINE SULFATE (PF) 4 MG/ML IV SOLN
4.0000 mg | Freq: Once | INTRAVENOUS | Status: AC
  Administered 2021-01-15: 4 mg via INTRAVENOUS
  Filled 2021-01-15: qty 1

## 2021-01-15 MED ORDER — IOHEXOL 300 MG/ML  SOLN
100.0000 mL | Freq: Once | INTRAMUSCULAR | Status: AC | PRN
  Administered 2021-01-15: 100 mL via INTRAVENOUS

## 2021-01-15 MED ORDER — HYDROMORPHONE HCL 1 MG/ML IJ SOLN
1.0000 mg | Freq: Once | INTRAMUSCULAR | Status: AC
  Administered 2021-01-15: 1 mg via INTRAVENOUS
  Filled 2021-01-15: qty 1

## 2021-01-15 NOTE — ED Notes (Signed)
ED Provider at bedside. 
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
## 2021-01-15 NOTE — ED Notes (Signed)
Patient transported to CT via stretcher, sr x 2 up, pt is more alert and cooperative at this time, calm, answers questions. Appears more comfortable
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
## 2021-01-15 NOTE — ED Notes (Signed)
Since arrival, pt kicking legs on stretcher, very restless, screaming for pain medicine. Explained nursing care and assessment that was being done, attempting to provide comfort measures and reassure client. Explained reason for C collar being on.

## 2021-01-15 NOTE — Discharge Instructions (Addendum)
Call your primary care doctor or specialist as discussed in the next 2-3 days.   Return immediately back to the ER if:  Your symptoms worsen within the next 12-24 hours. You develop new symptoms such as new fevers, persistent vomiting, new pain, shortness of breath, or new weakness or numbness, or if you have any other concerns.  

## 2021-01-15 NOTE — ED Notes (Addendum)
Attempted to urinate, cannot produce urine at this time. Continues on with restlessness and being loud at this time.

## 2021-01-15 NOTE — ED Notes (Signed)
Pt continues to scream and states "I am dying and I am scared", pt reassured, explained medication given for pain. Refuses to answer some questions. MD informed of pt's action

## 2021-01-15 NOTE — ED Notes (Signed)
Pt. Refused shoulder immobilizer.

## 2021-01-17 ENCOUNTER — Emergency Department (HOSPITAL_BASED_OUTPATIENT_CLINIC_OR_DEPARTMENT_OTHER)
Admission: EM | Admit: 2021-01-17 | Discharge: 2021-01-18 | Disposition: A | Discharge status: Home / Self Care | Payer: Medicaid Other | Attending: Emergency Medicine | Admitting: Emergency Medicine

## 2021-01-17 ENCOUNTER — Encounter (HOSPITAL_BASED_OUTPATIENT_CLINIC_OR_DEPARTMENT_OTHER): Payer: Self-pay | Admitting: Emergency Medicine

## 2021-01-17 ENCOUNTER — Other Ambulatory Visit: Payer: Self-pay

## 2021-01-17 DIAGNOSIS — M5412 Radiculopathy, cervical region: Secondary | ICD-10-CM | POA: Diagnosis not present

## 2021-01-17 DIAGNOSIS — F17210 Nicotine dependence, cigarettes, uncomplicated: Secondary | ICD-10-CM | POA: Diagnosis not present

## 2021-01-17 DIAGNOSIS — Y92410 Unspecified street and highway as the place of occurrence of the external cause: Secondary | ICD-10-CM | POA: Insufficient documentation

## 2021-01-17 DIAGNOSIS — S4991XA Unspecified injury of right shoulder and upper arm, initial encounter: Secondary | ICD-10-CM | POA: Diagnosis not present
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
## 2021-01-18 MED ORDER — OXYCODONE-ACETAMINOPHEN 5-325 MG PO TABS
1.0000 | ORAL_TABLET | Freq: Once | ORAL | Status: AC
  Administered 2021-01-18: 1 via ORAL
  Filled 2021-01-18: qty 1

## 2021-01-18 MED ORDER — OXYCODONE-ACETAMINOPHEN 5-325 MG PO TABS: 1.0000 | ORAL_TABLET | ORAL | 0 refills | Status: DC | PRN

## 2021-01-18 MED ORDER — IBUPROFEN 800 MG PO TABS
800.0000 mg | ORAL_TABLET | Freq: Once | ORAL | Status: AC | PRN
  Administered 2021-01-18: 800 mg via ORAL
  Filled 2021-01-18: qty 1
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
## 2021-01-19 ENCOUNTER — Ambulatory Visit: Payer: Self-pay

## 2021-01-19 ENCOUNTER — Ambulatory Visit (HOSPITAL_BASED_OUTPATIENT_CLINIC_OR_DEPARTMENT_OTHER)
Admission: RE | Admit: 2021-01-19 | Discharge: 2021-01-19 | Disposition: A | Discharge status: Home / Self Care | Payer: Medicaid Other | Source: Ambulatory Visit | Attending: Family Medicine | Admitting: Family Medicine

## 2021-01-19 ENCOUNTER — Encounter: Payer: Self-pay | Admitting: Family Medicine

## 2021-01-19 ENCOUNTER — Other Ambulatory Visit: Payer: Self-pay

## 2021-01-19 ENCOUNTER — Ambulatory Visit (INDEPENDENT_AMBULATORY_CARE_PROVIDER_SITE_OTHER): Payer: Medicaid Other | Admitting: Family Medicine

## 2021-01-19 VITALS — BP 130/88 | HR 76 | Ht 64.0 in | Wt 194.0 lb

## 2021-01-19 DIAGNOSIS — M79622 Pain in left upper arm: Secondary | ICD-10-CM | POA: Diagnosis not present

## 2021-01-19 DIAGNOSIS — Z8781 Personal history of (healed) traumatic fracture: Secondary | ICD-10-CM | POA: Diagnosis not present

## 2021-01-19 DIAGNOSIS — M546 Pain in thoracic spine: Secondary | ICD-10-CM

## 2021-01-19 DIAGNOSIS — S62212D Bennett's fracture, left hand, subsequent encounter for fracture with routine healing: Secondary | ICD-10-CM

## 2021-01-19 DIAGNOSIS — M7552 Bursitis of left shoulder: Secondary | ICD-10-CM

## 2021-01-19 DIAGNOSIS — M25512 Pain in left shoulder: Secondary | ICD-10-CM | POA: Diagnosis not present

## 2021-01-19 DIAGNOSIS — S6992XA Unspecified injury of left wrist, hand and finger(s), initial encounter: Secondary | ICD-10-CM | POA: Diagnosis not present

## 2021-01-19 MED ORDER — CYCLOBENZAPRINE HCL 10 MG PO TABS: 10.0000 mg | ORAL_TABLET | Freq: Three times a day (TID) | ORAL | 1 refills | Status: DC | PRN

## 2021-01-19 NOTE — Patient Instructions (Signed)
Nice to meet you Please try heat  Please try the muscle relaxer.  Please call for physical therapy. [phone] I will call with the results from today  Please send me a message in MyChart with any questions or updates.  Please see me back in 4 weeks .   --Dr. Jordan Likes
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
## 2021-01-20 ENCOUNTER — Telehealth: Payer: Self-pay | Admitting: Family Medicine

## 2021-01-20 DIAGNOSIS — Z8781 Personal history of (healed) traumatic fracture: Secondary | ICD-10-CM | POA: Insufficient documentation

## 2021-01-20 DIAGNOSIS — M546 Pain in thoracic spine: Secondary | ICD-10-CM | POA: Insufficient documentation

## 2021-01-20 DIAGNOSIS — M7552 Bursitis of left shoulder: Secondary | ICD-10-CM | POA: Insufficient documentation

## 2021-01-20 NOTE — Assessment & Plan Note (Signed)
No significant structural changes of the rotator cuff.  Does have a small bursitis on exam.  Likely has spasm around the joint as well. - Counseled on home exercise therapy and supportive care. -X-ray. -Referral to physical therapy. -Could consider further imaging
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
## 2021-01-30 ENCOUNTER — Encounter (HOSPITAL_BASED_OUTPATIENT_CLINIC_OR_DEPARTMENT_OTHER): Payer: Self-pay | Admitting: Emergency Medicine

## 2021-01-30 ENCOUNTER — Other Ambulatory Visit: Payer: Self-pay

## 2021-01-30 ENCOUNTER — Emergency Department (HOSPITAL_BASED_OUTPATIENT_CLINIC_OR_DEPARTMENT_OTHER)
Admission: EM | Admit: 2021-01-30 | Discharge: 2021-01-30 | Disposition: A | Discharge status: Home / Self Care | Payer: Medicaid Other | Attending: Emergency Medicine | Admitting: Emergency Medicine

## 2021-01-30 ENCOUNTER — Emergency Department (HOSPITAL_COMMUNITY)
Admission: EM | Admit: 2021-01-30 | Discharge: 2021-01-30 | Discharge status: Against Medical Advice | Payer: No Typology Code available for payment source

## 2021-01-30 DIAGNOSIS — F17290 Nicotine dependence, other tobacco product, uncomplicated: Secondary | ICD-10-CM | POA: Diagnosis not present

## 2021-01-30 DIAGNOSIS — M25512 Pain in left shoulder: Secondary | ICD-10-CM | POA: Diagnosis not present

## 2021-01-30 MED ORDER — DEXAMETHASONE SODIUM PHOSPHATE 4 MG/ML IJ SOLN
4.0000 mg | Freq: Once | INTRAMUSCULAR | Status: AC
  Administered 2021-01-30: 4 mg via INTRAMUSCULAR
  Filled 2021-01-30: qty 1

## 2021-01-30 MED ORDER — LIDOCAINE 5 % EX PTCH: 1.0000 | MEDICATED_PATCH | CUTANEOUS | 0 refills | Status: DC

## 2021-01-30 MED ORDER — NAPROXEN 250 MG PO TABS
500.0000 mg | ORAL_TABLET | Freq: Once | ORAL | Status: AC
  Administered 2021-01-30: 500 mg via ORAL
  Filled 2021-01-30: qty 2

## 2021-01-30 MED ORDER — PREDNISONE 50 MG PO TABS
60.0000 mg | ORAL_TABLET | Freq: Once | ORAL | Status: DC
  Filled 2021-01-30: qty 1

## 2021-01-30 MED ORDER — PREDNISONE 20 MG PO TABS: ORAL_TABLET | ORAL | 0 refills | Status: DC

## 2021-01-30 MED ORDER — NAPROXEN 375 MG PO TABS: 375.0000 mg | ORAL_TABLET | Freq: Two times a day (BID) | ORAL | 0 refills | Status: DC

## 2021-01-30 MED ORDER — LIDOCAINE 5 % EX PTCH
2.0000 | MEDICATED_PATCH | CUTANEOUS | Status: DC
  Administered 2021-01-30: 2 via TRANSDERMAL
  Filled 2021-01-30: qty 2

## 2021-01-30 NOTE — ED Notes (Signed)
Patient called for triage x2 with no response
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
## 2021-01-30 NOTE — ED Notes (Signed)
Pt not found in lobby 
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
## 2021-01-30 NOTE — ED Notes (Signed)
See EDP assessment; pt ambulatory and stable

## 2021-02-15 ENCOUNTER — Ambulatory Visit: Payer: Medicaid Other | Admitting: Family Medicine
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
## 2021-04-30 ENCOUNTER — Emergency Department (HOSPITAL_COMMUNITY)
Admission: EM | Admit: 2021-04-30 | Discharge: 2021-04-30 | Discharge status: Against Medical Advice | Payer: Medicaid Other | Attending: Emergency Medicine | Admitting: Emergency Medicine

## 2021-04-30 ENCOUNTER — Other Ambulatory Visit: Payer: Self-pay

## 2021-04-30 ENCOUNTER — Encounter (HOSPITAL_COMMUNITY): Payer: Self-pay | Admitting: *Deleted

## 2021-04-30 DIAGNOSIS — Z5321 Procedure and treatment not carried out due to patient leaving prior to being seen by health care provider: Secondary | ICD-10-CM | POA: Insufficient documentation

## 2021-04-30 DIAGNOSIS — M79645 Pain in left finger(s): Secondary | ICD-10-CM | POA: Diagnosis not present

## 2021-04-30 NOTE — ED Notes (Signed)
Called pt x2 for room, no response. 

## 2021-04-30 NOTE — ED Triage Notes (Signed)
Pt is here with left middle finger pain and no injury. No swelling.  Had patient remove ring.

## 2021-04-30 NOTE — ED Notes (Signed)
Called pt x2 for vitals, no response. °

## 2021-10-05 DIAGNOSIS — Z419 Encounter for procedure for purposes other than remedying health state, unspecified: Secondary | ICD-10-CM | POA: Diagnosis not present

## 2021-10-14 ENCOUNTER — Other Ambulatory Visit: Payer: Self-pay

## 2021-10-14 ENCOUNTER — Encounter (HOSPITAL_COMMUNITY): Payer: Self-pay

## 2021-10-14 ENCOUNTER — Ambulatory Visit (HOSPITAL_COMMUNITY)
Admission: EM | Admit: 2021-10-14 | Discharge: 2021-10-14 | Disposition: A | Discharge status: Home / Self Care | Payer: Medicaid Other | Attending: Emergency Medicine | Admitting: Emergency Medicine

## 2021-10-14 DIAGNOSIS — K0889 Other specified disorders of teeth and supporting structures: Secondary | ICD-10-CM | POA: Diagnosis not present

## 2021-10-14 MED ORDER — KETOROLAC TROMETHAMINE 30 MG/ML IJ SOLN
30.0000 mg | Freq: Once | INTRAMUSCULAR | Status: AC
  Administered 2021-10-14: 30 mg via INTRAMUSCULAR

## 2021-10-14 MED ORDER — KETOROLAC TROMETHAMINE 30 MG/ML IJ SOLN
INTRAMUSCULAR | Status: AC
  Filled 2021-10-14: qty 1

## 2021-10-14 MED ORDER — KETOROLAC TROMETHAMINE 10 MG PO TABS: 10.0000 mg | ORAL_TABLET | Freq: Four times a day (QID) | ORAL | 0 refills | Status: AC | PRN

## 2021-10-14 MED ORDER — CLINDAMYCIN HCL 300 MG PO CAPS: 300.0000 mg | ORAL_CAPSULE | Freq: Three times a day (TID) | ORAL | 0 refills | Status: AC

## 2021-10-14 NOTE — ED Triage Notes (Signed)
Pt presents with tooth pain x 1 month.

## 2021-10-14 NOTE — Discharge Instructions (Addendum)
Take clindamycin 3 times daily for the next 7 days. He can take Toradol up to 4 times daily for the next 5 days. Please make appointment to be seen by dentist.
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
## 2021-10-22 ENCOUNTER — Other Ambulatory Visit: Payer: Self-pay

## 2021-10-22 ENCOUNTER — Encounter (HOSPITAL_BASED_OUTPATIENT_CLINIC_OR_DEPARTMENT_OTHER): Payer: Self-pay | Admitting: Emergency Medicine

## 2021-10-22 ENCOUNTER — Emergency Department (HOSPITAL_BASED_OUTPATIENT_CLINIC_OR_DEPARTMENT_OTHER)
Admission: EM | Admit: 2021-10-22 | Discharge: 2021-10-22 | Disposition: A | Discharge status: Home / Self Care | Payer: Medicaid Other | Attending: Emergency Medicine | Admitting: Emergency Medicine

## 2021-10-22 DIAGNOSIS — F17210 Nicotine dependence, cigarettes, uncomplicated: Secondary | ICD-10-CM | POA: Insufficient documentation

## 2021-10-22 DIAGNOSIS — K029 Dental caries, unspecified: Secondary | ICD-10-CM | POA: Diagnosis not present

## 2021-10-22 DIAGNOSIS — K0889 Other specified disorders of teeth and supporting structures: Secondary | ICD-10-CM | POA: Diagnosis not present

## 2021-10-22 MED ORDER — OXYCODONE-ACETAMINOPHEN 5-325 MG PO TABS: 1.0000 | ORAL_TABLET | ORAL | 0 refills | Status: DC | PRN

## 2021-10-22 MED ORDER — BUPIVACAINE-EPINEPHRINE (PF) 0.5% -1:200000 IJ SOLN
1.8000 mL | Freq: Once | INTRAMUSCULAR | Status: AC
  Administered 2021-10-22: 07:00:00 1.8 mL
  Filled 2021-10-22: qty 1.8

## 2021-10-22 NOTE — ED Triage Notes (Signed)
Reports continued pain to tooth on left lower side for a week.  Seen at [facility].  Reports they put him on pain meds and antibiotics.  Still taking the antibiotics.  Reports the pain meds aren't helping.
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
## 2021-11-05 DIAGNOSIS — Z419 Encounter for procedure for purposes other than remedying health state, unspecified: Secondary | ICD-10-CM | POA: Diagnosis not present

## 2021-11-11 ENCOUNTER — Encounter (HOSPITAL_BASED_OUTPATIENT_CLINIC_OR_DEPARTMENT_OTHER): Payer: Self-pay | Admitting: Emergency Medicine

## 2021-11-11 ENCOUNTER — Other Ambulatory Visit: Payer: Self-pay

## 2021-11-11 ENCOUNTER — Emergency Department (HOSPITAL_BASED_OUTPATIENT_CLINIC_OR_DEPARTMENT_OTHER)
Admission: EM | Admit: 2021-11-11 | Discharge: 2021-11-11 | Disposition: A | Discharge status: Home / Self Care | Payer: Medicaid Other | Attending: Emergency Medicine | Admitting: Emergency Medicine

## 2021-11-11 DIAGNOSIS — K0889 Other specified disorders of teeth and supporting structures: Secondary | ICD-10-CM | POA: Diagnosis not present

## 2021-11-11 DIAGNOSIS — K029 Dental caries, unspecified: Secondary | ICD-10-CM | POA: Insufficient documentation

## 2021-11-11 MED ORDER — OXYCODONE-ACETAMINOPHEN 5-325 MG PO TABS: 1.0000 | ORAL_TABLET | Freq: Four times a day (QID) | ORAL | 0 refills | Status: DC | PRN

## 2021-11-11 MED ORDER — LIDOCAINE-EPINEPHRINE-TETRACAINE (LET) TOPICAL GEL
3.0000 mL | Freq: Once | TOPICAL | Status: AC
  Administered 2021-11-11: 3 mL via TOPICAL
  Filled 2021-11-11: qty 3

## 2021-11-11 MED ORDER — LIDOCAINE-EPINEPHRINE 2 %-1:100000 IJ SOLN: 20.0000 mL | Freq: Once | INTRAMUSCULAR | Status: DC

## 2021-11-11 MED ORDER — CLINDAMYCIN HCL 150 MG PO CAPS: 150.0000 mg | ORAL_CAPSULE | Freq: Four times a day (QID) | ORAL | 0 refills | Status: AC

## 2021-11-11 MED ORDER — OXYCODONE-ACETAMINOPHEN 5-325 MG PO TABS
1.0000 | ORAL_TABLET | Freq: Once | ORAL | Status: AC
  Administered 2021-11-11: 1 via ORAL
  Filled 2021-11-11: qty 1

## 2021-11-11 MED ORDER — BUPIVACAINE-EPINEPHRINE (PF) 0.5% -1:200000 IJ SOLN
20.0000 mL | Freq: Once | INTRAMUSCULAR | Status: AC
  Administered 2021-11-11: 20 mL

## 2021-11-11 NOTE — ED Provider Notes (Signed)
[facility] EMERGENCY DEPARTMENT Provider Note   CSN: [id number] Arrival date & time: 11/11/21  1259     History  Chief Complaint  Patient presents with   Dental Pain    Jay Weaver is a 36 y.o. male.  Presents with worsening dental pain.  He has pain on his right most posterior molar.  He also has pain in his left inferior posterior molar.  He describes throbbing around his left ear. Both of these areas are bad cavities that have not been treated.  He has an appointment with a dentist in 3 weeks.  He has been seen multiple times in the emergency department and has received antibiotics, nerve blocks, pain medication and is still not having relief.  He denies any fevers.  Denies facial swelling.   Dental Pain     Home Medications Prior to Admission medications   Medication Sig Start Date End Date Taking? Authorizing Provider  clindamycin (CLEOCIN) 150 MG capsule Take 1 capsule (150 mg total) by mouth 4 (four) times daily for 10 days. 11/11/21 11/21/21 Yes Banjamin Stovall, Finis Bud, PA-C  oxyCODONE-acetaminophen (PERCOCET/ROXICET) 5-325 MG tablet Take 1 tablet by mouth every 6 (six) hours as needed for severe pain. 11/11/21  Yes Sarya Linenberger, Finis Bud, PA-C      Allergies    Penicillins    Review of Systems   Review of Systems  HENT:  Positive for dental problem.   All other systems reviewed and are negative.  Physical Exam Updated Vital Signs BP 123/70 (BP Location: Right Arm)    Pulse 86    Temp 98.7 F (37.1 C) (Oral)    Resp 14    Ht 5\' 2"  (1.575 m)    Wt 81.2 kg    SpO2 100%    BMI 32.74 kg/m  Physical Exam Vitals and nursing note reviewed.  Constitutional:      General: He is not in acute distress.    Appearance: Normal appearance. He is well-developed. He is not ill-appearing, toxic-appearing or diaphoretic.  HENT:     Head: Normocephalic and atraumatic.     Nose: No nasal deformity.     Mouth/Throat:     Lips: Pink. No lesions.     Dentition: Abnormal dentition.  Dental tenderness and dental caries present. No dental abscesses.      Comments: Both areas of dental tenderness are with areas of decay. I am able to visualize pulp. No palpable abscess. Eyes:     General: Gaze aligned appropriately. No scleral icterus.       Right eye: No discharge.        Left eye: No discharge.     Conjunctiva/sclera: Conjunctivae normal.     Right eye: Right conjunctiva is not injected. No exudate or hemorrhage.    Left eye: Left conjunctiva is not injected. No exudate or hemorrhage. Pulmonary:     Effort: Pulmonary effort is normal. No respiratory distress.  Skin:    General: Skin is warm and dry.  Neurological:     Mental Status: He is alert and oriented to person, place, and time.  Psychiatric:        Mood and Affect: Mood normal.        Speech: Speech normal.        Behavior: Behavior normal. Behavior is cooperative.    ED Results / Procedures / Treatments   Labs (all labs ordered are listed, but only abnormal results are displayed) Labs Reviewed - No data to display  EKG  None  Radiology No results found.  Procedures .Nerve Block  Date/Time: 11/11/2021 3:37 PM Performed by: Claudie Leach, PA-C Authorized by: Claudie Leach, PA-C   Consent:    Consent obtained:  Verbal   Consent given by:  Patient   Risks, benefits, and alternatives were discussed: yes     Risks discussed:  Allergic reaction, infection, pain, nerve damage, swelling, unsuccessful block, intravenous injection and bleeding   Alternatives discussed:  No treatment, alternative treatment, delayed treatment and referral Universal protocol:    Procedure explained and questions answered to patient or proxy's satisfaction: yes     Patient identity confirmed:  Verbally with patient Indications:    Indications:  Pain relief Location:    Body area:  Head   Head nerve blocked: Inferior alveolar.   Laterality:  Left Skin anesthesia:    Skin anesthesia method:  Topical application  and local infiltration   Topical anesthetic:  LET   Local anesthetic:  Bupivacaine 0.5% WITH epi Procedure details:    Block needle gauge:  27 G   Anesthetic injected:  Bupivacaine 0.5% WITH epi   Steroid injected:  None   Additive injected:  None   Injection procedure:  Anatomic landmarks identified, anatomic landmarks palpated, introduced needle, incremental injection and negative aspiration for blood   Paresthesia:  Immediately resolved Post-procedure details:    Outcome:  Anesthesia achieved   Procedure completion:  Tolerated well, no immediate complications   Medications Ordered in ED Medications  bupivacaine-epinephrine (MARCAINE W/ EPI) 0.5% -1:200000 injection 20 mL (20 mLs Infiltration Given by Other 11/11/21 1509)  lidocaine-EPINEPHrine-tetracaine (LET) topical gel (3 mLs Topical Given 11/11/21 1458)  oxyCODONE-acetaminophen (PERCOCET/ROXICET) 5-325 MG per tablet 1 tablet (1 tablet Oral Given 11/11/21 1538)    ED Course/ Medical Decision Making/ A&P Clinical Course as of 11/11/21 1540  Sat Nov 11, 2021  1506 Dental block in - inf alveolar [GL]    Clinical Course User Index [GL] Claudie Leach, PA-C                           Medical Decision Making Problems Addressed: Pain, dental: acute illness or injury  Amount and/or Complexity of Data Reviewed External Data Reviewed: notes.    Details: Urgent Care notes, previous ED visit  Risk OTC drugs. Prescription drug management. Parenteral controlled substances.   Patient is with dental pain of both inferior left most posterior molar and superior right this posterior molar.  Both areas are with areas of decay.  I can visualize the pulp.  They are very tender to the touch.  I did not palpate any abscesses.  There is no facial swelling.  Patient without systemic symptoms. Reviewed past records and looks like patient has been seen in urgent care and has been treated with clindamycin.  He was also seen once again at the ED  where he got more antibiotics and a nerve block at that time.  He has a dental appointment in 3 weeks.  He is unable to go to any other dentist because of his insurance. Successful inferior alveolar nerve block with anesthesia achieved. Plan to send home with course of clindamycin and oxycodone.  He needs to go to his dentist as soon as he can.  Final Clinical Impression(s) / ED Diagnoses Final diagnoses:  Pain, dental    Rx / DC Orders ED Discharge Orders          Ordered    clindamycin (  CLEOCIN) 150 MG capsule  4 times daily        11/11/21 1533    oxyCODONE-acetaminophen (PERCOCET/ROXICET) 5-325 MG tablet  Every 6 hours PRN        11/11/21 1533              Darrion Wyszynski, Finis BudGrace C, PA-C 11/11/21 1540    Alvira MondaySchlossman, Erin, MD 11/12/21 0004

## 2021-11-11 NOTE — Discharge Instructions (Addendum)
Please follow up with your dentist appt. Please take antibiotics as prescribed. I have provided you with pain medication over the next couple of days. Please do not take this prior to driving or operating heavy machinery.

## 2021-11-11 NOTE — ED Triage Notes (Signed)
Pt arrives pov with c/o left lower and right upper dental pain. Reports pain meds not working and cant seen dentis for 3 weeks.

## 2021-11-11 NOTE — ED Notes (Signed)
Pt standing in hallway yelling that he needs some pain medicine; pt instructed to go back into his room and when there is an order for pain medicine, it will be provided to him. Pt remains in hallway.

## 2021-12-06 DIAGNOSIS — Z419 Encounter for procedure for purposes other than remedying health state, unspecified: Secondary | ICD-10-CM | POA: Diagnosis not present

## 2021-12-24 ENCOUNTER — Emergency Department (HOSPITAL_BASED_OUTPATIENT_CLINIC_OR_DEPARTMENT_OTHER)
Admission: EM | Admit: 2021-12-24 | Discharge: 2021-12-24 | Disposition: A | Discharge status: Home / Self Care | Payer: Medicaid Other | Attending: Emergency Medicine | Admitting: Emergency Medicine

## 2021-12-24 ENCOUNTER — Other Ambulatory Visit: Payer: Self-pay

## 2021-12-24 ENCOUNTER — Encounter (HOSPITAL_BASED_OUTPATIENT_CLINIC_OR_DEPARTMENT_OTHER): Payer: Self-pay | Admitting: Emergency Medicine

## 2021-12-24 DIAGNOSIS — R456 Violent behavior: Secondary | ICD-10-CM | POA: Insufficient documentation

## 2021-12-24 DIAGNOSIS — K029 Dental caries, unspecified: Secondary | ICD-10-CM | POA: Insufficient documentation

## 2021-12-24 DIAGNOSIS — Z765 Malingerer [conscious simulation]: Secondary | ICD-10-CM

## 2021-12-24 DIAGNOSIS — R4689 Other symptoms and signs involving appearance and behavior: Secondary | ICD-10-CM | POA: Diagnosis not present

## 2021-12-24 DIAGNOSIS — R454 Irritability and anger: Secondary | ICD-10-CM | POA: Insufficient documentation

## 2021-12-24 DIAGNOSIS — K0889 Other specified disorders of teeth and supporting structures: Secondary | ICD-10-CM | POA: Diagnosis not present

## 2021-12-24 MED ORDER — MELOXICAM 15 MG PO TABS: 15.0000 mg | ORAL_TABLET | Freq: Every day | ORAL | 0 refills | Status: DC

## 2021-12-24 MED ORDER — LIDOCAINE VISCOUS HCL 2 % MT SOLN
15.0000 mL | Freq: Once | OROMUCOSAL | Status: AC
  Administered 2021-12-24: 15 mL via OROMUCOSAL
  Filled 2021-12-24: qty 15

## 2021-12-24 MED ORDER — NAPROXEN 500 MG PO TABS: 500.0000 mg | ORAL_TABLET | Freq: Two times a day (BID) | ORAL | 0 refills | Status: DC

## 2021-12-24 NOTE — ED Notes (Addendum)
Pt asking for Oxycodone, pacing in room upset that he will not get a prescription for this on d/c. Ordered viscous Lidocaine has been given. Pt states he has to leave and go get his kids on the bus, left with d/c papers

## 2021-12-24 NOTE — ED Notes (Signed)
Patient verbalizes understanding of discharge instructions. Opportunity for questioning and answers were provided. Armband removed by staff, pt discharged from ED. Ambulated out to lobby  

## 2021-12-24 NOTE — ED Provider Notes (Signed)
[facility] EMERGENCY DEPARTMENT Provider Note   CSN: [id number] Arrival date & time: 12/24/21  0500     History  Chief Complaint  Patient presents with   Dental Pain    Jay Weaver is a 36 y.o. male.  The history is provided by the patient.  Dental Pain Location:  Lower Lower teeth location:  18/LL 2nd molar, 19/LL 1st molar and 20/LL 2nd bicuspid Quality:  Aching Severity:  Severe Onset quality:  Gradual Duration: weeks, reportedly had teeth extract. Timing:  Constant Progression:  Unchanged Context: dental caries and poor dentition   Context comment:  Claims he had teeth extracted but they are still there Previous work-up:  Dental exam Relieved by:  Nothing Worsened by:  Nothing Ineffective treatments: clindamycin and oral narcotics. Associated symptoms: no facial swelling, no fever and no neck pain   Risk factors: no alcohol problem       Home Medications Prior to Admission medications   Medication Sig Start Date End Date Taking? Authorizing Provider  meloxicam (MOBIC) 15 MG tablet Take 1 tablet (15 mg total) by mouth daily. 12/24/21  Yes Izeyah Deike, MD  oxyCODONE-acetaminophen (PERCOCET/ROXICET) 5-325 MG tablet Take 1 tablet by mouth every 6 (six) hours as needed for severe pain. 11/11/21   Loeffler, Finis Bud, PA-C      Allergies    Penicillins    Review of Systems   Review of Systems  Constitutional:  Negative for fever.  HENT:  Positive for dental problem. Negative for facial swelling.   Eyes:  Negative for redness.  Respiratory:  Negative for shortness of breath.   Cardiovascular:  Negative for chest pain.  Gastrointestinal:  Negative for abdominal pain.  Genitourinary:  Negative for difficulty urinating.  Musculoskeletal:  Negative for neck pain.  Skin:  Negative for rash.  Neurological:  Negative for facial asymmetry.  Psychiatric/Behavioral:  Negative for agitation.   All other systems reviewed and are negative.  Physical  Exam Updated Vital Signs BP (!) 128/92    Pulse 92    Temp 98.1 F (36.7 C) (Oral)    Resp 18    Wt 81.2 kg    SpO2 100%    BMI 32.74 kg/m  Physical Exam Vitals and nursing note reviewed.  Constitutional:      General: He is not in acute distress.    Appearance: Normal appearance.  HENT:     Head: Normocephalic and atraumatic.     Nose: Nose normal.     Mouth/Throat:     Mouth: Mucous membranes are moist.     Dentition: Abnormal dentition. Dental caries present.     Comments: The placed in the mouth that the teeth he reports were extracted there are still teeth with caries in those positions  Eyes:     Conjunctiva/sclera: Conjunctivae normal.     Pupils: Pupils are equal, round, and reactive to light.  Cardiovascular:     Rate and Rhythm: Normal rate and regular rhythm.     Pulses: Normal pulses.     Heart sounds: Normal heart sounds.  Pulmonary:     Effort: Pulmonary effort is normal.     Breath sounds: Normal breath sounds.  Abdominal:     General: Abdomen is flat. Bowel sounds are normal.     Palpations: Abdomen is soft.     Tenderness: There is no abdominal tenderness. There is no guarding.  Musculoskeletal:        General: Normal range of motion.  Cervical back: Normal range of motion and neck supple.  Skin:    General: Skin is warm and dry.     Capillary Refill: Capillary refill takes less than 2 seconds.  Neurological:     General: No focal deficit present.     Mental Status: He is alert and oriented to person, place, and time.     Deep Tendon Reflexes: Reflexes normal.  Psychiatric:        Mood and Affect: Affect is labile and angry.        Behavior: Behavior is aggressive.    ED Results / Procedures / Treatments   Labs (all labs ordered are listed, but only abnormal results are displayed) Labs Reviewed - No data to display  EKG None  Radiology No results found.  Procedures Procedures    Medications Ordered in ED Medications  lidocaine  (XYLOCAINE) 2 % viscous mouth solution 15 mL (15 mLs Mouth/Throat Given 12/24/21 0532)    ED Course/ Medical Decision Making/ A&P                           Medical Decision Making Dental pain reportedly post extraction   Risk Prescription drug management. Risk Details: He's reportedly on antibiotics and according to the [employer] he has multiple narcotic RX.  Patient was informed he is not getting more narcotics and that there are still teeth at the reported extraction sites.  Patient then reported he "had to get his kids on the bus"but it is Sunday morning. This is consistent with drug seeking behavior.  Follow up with dentistry.      Final Clinical Impression(s) / ED Diagnoses Final diagnoses:  Pain, dental  Pain due to dental caries   Return for intractable cough, coughing up blood, fevers > 100.4 unrelieved by medication, shortness of breath, intractable vomiting, chest pain, shortness of breath, weakness, numbness, changes in speech, facial asymmetry, abdominal pain, passing out, Inability to tolerate liquids or food, cough, altered mental status or any concerns. No signs of systemic illness or infection. The patient is nontoxic-appearing on exam and vital signs are within normal limits.  I have reviewed the triage vital signs and the nursing notes. Pertinent labs & imaging results that were available during my care of the patient were reviewed by me and considered in my medical decision making (see chart for details). After history, exam, and medical workup I feel the patient has been appropriately medically screened and is safe for discharge home. Pertinent diagnoses were discussed with the patient. Patient was given return precautions.   Ronith Berti, MD 12/24/21 0710

## 2021-12-24 NOTE — ED Triage Notes (Signed)
Pt in with L bottom posterior molar pain, s/p wisdom tooth extraction 3 days ago. Denies any fevers, but pain has worsened. Unrelieved with Codeine and Ibuprofen. Currently on dose of Clindamycin

## 2022-01-03 DIAGNOSIS — Z419 Encounter for procedure for purposes other than remedying health state, unspecified: Secondary | ICD-10-CM | POA: Diagnosis not present

## 2022-01-20 ENCOUNTER — Emergency Department (HOSPITAL_BASED_OUTPATIENT_CLINIC_OR_DEPARTMENT_OTHER)
Admission: EM | Admit: 2022-01-20 | Discharge: 2022-01-20 | Disposition: A | Discharge status: Home / Self Care | Payer: Medicaid Other | Attending: Student | Admitting: Student

## 2022-01-20 ENCOUNTER — Other Ambulatory Visit: Payer: Self-pay

## 2022-01-20 ENCOUNTER — Encounter (HOSPITAL_BASED_OUTPATIENT_CLINIC_OR_DEPARTMENT_OTHER): Payer: Self-pay | Admitting: Emergency Medicine

## 2022-01-20 ENCOUNTER — Emergency Department (HOSPITAL_BASED_OUTPATIENT_CLINIC_OR_DEPARTMENT_OTHER): Payer: Medicaid Other

## 2022-01-20 DIAGNOSIS — Y990 Civilian activity done for income or pay: Secondary | ICD-10-CM | POA: Diagnosis not present

## 2022-01-20 DIAGNOSIS — S62634A Displaced fracture of distal phalanx of right ring finger, initial encounter for closed fracture: Secondary | ICD-10-CM | POA: Diagnosis not present

## 2022-01-20 DIAGNOSIS — S6991XA Unspecified injury of right wrist, hand and finger(s), initial encounter: Secondary | ICD-10-CM | POA: Diagnosis present

## 2022-01-20 MED ORDER — OXYCODONE-ACETAMINOPHEN 5-325 MG PO TABS: 1.0000 | ORAL_TABLET | Freq: Four times a day (QID) | ORAL | 0 refills | Status: DC | PRN

## 2022-01-20 MED ORDER — OXYCODONE-ACETAMINOPHEN 5-325 MG PO TABS: 1.0000 | ORAL_TABLET | Freq: Once | ORAL | Status: DC

## 2022-01-20 NOTE — Discharge Instructions (Addendum)
You are seen in the emergency department today for finger injury. ? ?As we discussed you broke your right ring finger. We have splinted this for you and given you a dose of pain medication. I recommend continuing to ice it at home. I've prescribed you a short course of pain medication to take that you can alternate with ibuprofen.  ? ?I've attached the contact information for the orthopedist for you to call and schedule a follow up appointment. ?
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
## 2022-01-20 NOTE — Progress Notes (Addendum)
Pt not in lobby @ 1413 hrs.  ? ?Will check again.  ? ? ?

## 2022-01-20 NOTE — ED Notes (Signed)
EMT applying splint 

## 2022-01-22 ENCOUNTER — Other Ambulatory Visit: Payer: Self-pay

## 2022-01-22 ENCOUNTER — Encounter (HOSPITAL_COMMUNITY): Payer: Self-pay | Admitting: Emergency Medicine

## 2022-01-22 ENCOUNTER — Ambulatory Visit (HOSPITAL_COMMUNITY)
Admission: EM | Admit: 2022-01-22 | Discharge: 2022-01-22 | Disposition: A | Discharge status: Home / Self Care | Payer: Medicaid Other | Attending: Nurse Practitioner | Admitting: Nurse Practitioner

## 2022-01-22 DIAGNOSIS — S62634D Displaced fracture of distal phalanx of right ring finger, subsequent encounter for fracture with routine healing: Secondary | ICD-10-CM

## 2022-01-22 DIAGNOSIS — S62634A Displaced fracture of distal phalanx of right ring finger, initial encounter for closed fracture: Secondary | ICD-10-CM

## 2022-01-22 MED ORDER — OXYCODONE-ACETAMINOPHEN 5-325 MG PO TABS: 1.0000 | ORAL_TABLET | Freq: Four times a day (QID) | ORAL | 0 refills | Status: DC | PRN

## 2022-01-22 MED ORDER — OXYCODONE-ACETAMINOPHEN 5-325 MG PO TABS: 1.0000 | ORAL_TABLET | Freq: Four times a day (QID) | ORAL | 0 refills | Status: AC | PRN

## 2022-01-22 MED ORDER — IBUPROFEN 600 MG PO TABS: 600.0000 mg | ORAL_TABLET | Freq: Three times a day (TID) | ORAL | 0 refills | Status: DC | PRN

## 2022-01-22 NOTE — ED Triage Notes (Signed)
Reports he broke right ring finger 2 days ago.  Patient was instructed to go back to work today.  Patient says he cannot perform his duties.  Patient says he called orthopedic, but has not heard back from specialist.   ? ?Patient reports finger hurts worse.   ?
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
## 2022-01-22 NOTE — Discharge Instructions (Addendum)
-   Please call the hand specialist tomorrow ?- please continue wearing the finger splint, elevating your finger, and using ice on the finger ?- You can alternate the pain medication Percocet with the anti-inflammatory mediation ibuprofen.   ?

## 2022-02-03 DIAGNOSIS — Z419 Encounter for procedure for purposes other than remedying health state, unspecified: Secondary | ICD-10-CM | POA: Diagnosis not present

## 2022-03-05 DIAGNOSIS — Z419 Encounter for procedure for purposes other than remedying health state, unspecified: Secondary | ICD-10-CM | POA: Diagnosis not present

## 2022-04-05 DIAGNOSIS — Z419 Encounter for procedure for purposes other than remedying health state, unspecified: Secondary | ICD-10-CM | POA: Diagnosis not present

## 2022-05-05 DIAGNOSIS — Z419 Encounter for procedure for purposes other than remedying health state, unspecified: Secondary | ICD-10-CM | POA: Diagnosis not present

## 2022-06-05 DIAGNOSIS — Z419 Encounter for procedure for purposes other than remedying health state, unspecified: Secondary | ICD-10-CM | POA: Diagnosis not present

## 2022-07-06 DIAGNOSIS — Z419 Encounter for procedure for purposes other than remedying health state, unspecified: Secondary | ICD-10-CM | POA: Diagnosis not present

## 2022-07-23 DIAGNOSIS — M79602 Pain in left arm: Secondary | ICD-10-CM | POA: Diagnosis not present

## 2022-07-23 DIAGNOSIS — Z6833 Body mass index (BMI) 33.0-33.9, adult: Secondary | ICD-10-CM | POA: Diagnosis not present

## 2022-07-23 DIAGNOSIS — Z79899 Other long term (current) drug therapy: Secondary | ICD-10-CM | POA: Diagnosis not present

## 2022-07-25 DIAGNOSIS — Z79899 Other long term (current) drug therapy: Secondary | ICD-10-CM | POA: Diagnosis not present

## 2022-08-05 DIAGNOSIS — Z419 Encounter for procedure for purposes other than remedying health state, unspecified: Secondary | ICD-10-CM | POA: Diagnosis not present

## 2022-08-17 DIAGNOSIS — R892 Abnormal level of other drugs, medicaments and biological substances in specimens from other organs, systems and tissues: Secondary | ICD-10-CM | POA: Diagnosis not present

## 2022-08-17 DIAGNOSIS — M79602 Pain in left arm: Secondary | ICD-10-CM | POA: Diagnosis not present

## 2022-08-21 ENCOUNTER — Encounter (HOSPITAL_BASED_OUTPATIENT_CLINIC_OR_DEPARTMENT_OTHER): Payer: Self-pay | Admitting: Emergency Medicine

## 2022-08-21 ENCOUNTER — Emergency Department (HOSPITAL_BASED_OUTPATIENT_CLINIC_OR_DEPARTMENT_OTHER)
Admission: EM | Admit: 2022-08-21 | Discharge: 2022-08-21 | Disposition: A | Discharge status: Home / Self Care | Payer: Medicaid Other | Attending: Emergency Medicine | Admitting: Emergency Medicine

## 2022-08-21 ENCOUNTER — Other Ambulatory Visit: Payer: Self-pay

## 2022-08-21 ENCOUNTER — Emergency Department (HOSPITAL_BASED_OUTPATIENT_CLINIC_OR_DEPARTMENT_OTHER): Payer: Medicaid Other

## 2022-08-21 DIAGNOSIS — M79632 Pain in left forearm: Secondary | ICD-10-CM | POA: Diagnosis not present

## 2022-08-21 DIAGNOSIS — M79622 Pain in left upper arm: Secondary | ICD-10-CM | POA: Diagnosis not present

## 2022-08-21 DIAGNOSIS — M79602 Pain in left arm: Secondary | ICD-10-CM | POA: Diagnosis not present

## 2022-08-21 DIAGNOSIS — M899 Disorder of bone, unspecified: Secondary | ICD-10-CM | POA: Insufficient documentation

## 2022-08-21 LAB — CBC WITH DIFFERENTIAL/PLATELET
Abs Immature Granulocytes: 0.03 10*3/uL (ref 0.00–0.07)
Basophils Absolute: 0.1 10*3/uL (ref 0.0–0.1)
Basophils Relative: 1 %
Eosinophils Absolute: 0.2 10*3/uL (ref 0.0–0.5)
Eosinophils Relative: 2 %
HCT: 39 % (ref 39.0–52.0)
Hemoglobin: 13.3 g/dL (ref 13.0–17.0)
Immature Granulocytes: 0 %
Lymphocytes Relative: 37 %
Lymphs Abs: 3.5 10*3/uL (ref 0.7–4.0)
MCH: 30.2 pg (ref 26.0–34.0)
MCHC: 34.1 g/dL (ref 30.0–36.0)
MCV: 88.6 fL (ref 80.0–100.0)
Monocytes Absolute: 0.6 10*3/uL (ref 0.1–1.0)
Monocytes Relative: 6 %
Neutro Abs: 5 10*3/uL (ref 1.7–7.7)
Neutrophils Relative %: 54 %
Platelets: 289 10*3/uL (ref 150–400)
RBC: 4.4 MIL/uL (ref 4.22–5.81)
RDW: 14.5 % (ref 11.5–15.5)
WBC: 9.3 10*3/uL (ref 4.0–10.5)
nRBC: 0 % (ref 0.0–0.2)

## 2022-08-21 LAB — BASIC METABOLIC PANEL
Anion gap: 7 (ref 5–15)
BUN: 16 mg/dL (ref 6–20)
CO2: 25 mmol/L (ref 22–32)
Calcium: 9 mg/dL (ref 8.9–10.3)
Chloride: 106 mmol/L (ref 98–111)
Creatinine, Ser: 1.2 mg/dL (ref 0.61–1.24)
GFR, Estimated: >60 mL/min (ref >60)
Glucose, Bld: 117 mg/dL — ABNORMAL HIGH (ref 70–99)
Potassium: 3.7 mmol/L (ref 3.5–5.1)
Sodium: 138 mmol/L (ref 135–145)

## 2022-08-21 MED ORDER — LIDOCAINE 5 % EX PTCH
2.0000 | MEDICATED_PATCH | CUTANEOUS | Status: DC
  Administered 2022-08-21: 2 via TRANSDERMAL
  Filled 2022-08-21: qty 2

## 2022-08-21 MED ORDER — LIDOCAINE 5 % EX PTCH: 1.0000 | MEDICATED_PATCH | CUTANEOUS | 0 refills | Status: DC

## 2022-08-21 MED ORDER — DICLOFENAC SODIUM ER 100 MG PO TB24: 100.0000 mg | ORAL_TABLET | Freq: Every day | ORAL | 0 refills | Status: DC

## 2022-08-21 MED ORDER — KETOROLAC TROMETHAMINE 60 MG/2ML IM SOLN
60.0000 mg | Freq: Once | INTRAMUSCULAR | Status: AC
  Administered 2022-08-21: 60 mg via INTRAMUSCULAR
  Filled 2022-08-21: qty 2

## 2022-08-21 MED ORDER — IOHEXOL 300 MG/ML  SOLN
100.0000 mL | Freq: Once | INTRAMUSCULAR | Status: AC | PRN
  Administered 2022-08-21: 75 mL via INTRAVENOUS

## 2022-08-21 NOTE — Discharge Instructions (Signed)
Your history, exam, work-up today led [place] to get the x-rays followed by CT scans that did not show convincing evidence of deep abscess or bone infection at this time.  I do suspect some of your chronic pains from your previous surgery and injury have been evolving causing some nerve pain.  Please use the numbing patches to help and please follow-up with orthopedics for further management.  Dr. Percell Miller spoke to him on the phone and would like to see him in clinic.  Please call him to schedule that appointment.  Please use over-the-counter medications as well and rest and stay hydrated.  If any symptoms change acutely or worsen acutely, please return to the nearest emergency department.  If you did have to return, the [facility] emergency department is the emergency department has MRI capability 24 hours a day as further work-up may require MRI imaging that is likely the place to go.
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
## 2022-08-21 NOTE — ED Notes (Signed)
Pt returned to his room.

## 2022-08-21 NOTE — ED Notes (Addendum)
Pt states he needs to go to his car to get his phone charger and smoke a cigarette.  Pt left the department.
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
## 2022-08-21 NOTE — ED Notes (Signed)
Pt d/c home per MD order. Discharge summary reviewed with pt, pt verbalizes understanding. Ambulatory off unit. No s/s of acute distress noted at discharge.  °

## 2022-08-27 DIAGNOSIS — M25522 Pain in left elbow: Secondary | ICD-10-CM | POA: Diagnosis not present

## 2022-08-28 ENCOUNTER — Other Ambulatory Visit (HOSPITAL_COMMUNITY): Payer: Self-pay | Admitting: Orthopedic Surgery

## 2022-08-28 ENCOUNTER — Other Ambulatory Visit: Payer: Self-pay | Admitting: Orthopedic Surgery

## 2022-08-28 DIAGNOSIS — M869 Osteomyelitis, unspecified: Secondary | ICD-10-CM

## 2022-09-05 DIAGNOSIS — Z419 Encounter for procedure for purposes other than remedying health state, unspecified: Secondary | ICD-10-CM | POA: Diagnosis not present

## 2022-09-08 ENCOUNTER — Ambulatory Visit (HOSPITAL_COMMUNITY)
Admission: RE | Admit: 2022-09-08 | Discharge: 2022-09-08 | Disposition: A | Discharge status: Home / Self Care | Payer: Medicaid Other | Source: Ambulatory Visit | Attending: Orthopedic Surgery | Admitting: Orthopedic Surgery

## 2022-09-08 ENCOUNTER — Ambulatory Visit (HOSPITAL_COMMUNITY)
Admission: RE | Admit: 2022-09-08 | Discharge: 2022-09-08 | Disposition: A | Discharge status: Home / Self Care | Payer: Medicaid Other | Source: Ambulatory Visit | Attending: Diagnostic Radiology | Admitting: Diagnostic Radiology

## 2022-09-08 DIAGNOSIS — M869 Osteomyelitis, unspecified: Secondary | ICD-10-CM | POA: Insufficient documentation

## 2022-09-08 DIAGNOSIS — Z0189 Encounter for other specified special examinations: Secondary | ICD-10-CM | POA: Diagnosis not present

## 2022-09-08 DIAGNOSIS — Z01818 Encounter for other preprocedural examination: Secondary | ICD-10-CM | POA: Diagnosis not present

## 2022-09-08 DIAGNOSIS — R531 Weakness: Secondary | ICD-10-CM | POA: Diagnosis not present

## 2022-09-08 DIAGNOSIS — M19022 Primary osteoarthritis, left elbow: Secondary | ICD-10-CM | POA: Diagnosis not present

## 2022-09-08 MED ORDER — GADOBUTROL 1 MMOL/ML IV SOLN
9.0000 mL | Freq: Once | INTRAVENOUS | Status: AC | PRN
  Administered 2022-09-08: 9 mL via INTRAVENOUS

## 2022-09-12 ENCOUNTER — Other Ambulatory Visit (HOSPITAL_COMMUNITY): Payer: Self-pay | Admitting: Orthopedic Surgery

## 2022-09-12 DIAGNOSIS — M869 Osteomyelitis, unspecified: Secondary | ICD-10-CM

## 2022-09-17 ENCOUNTER — Emergency Department (HOSPITAL_BASED_OUTPATIENT_CLINIC_OR_DEPARTMENT_OTHER)
Admission: EM | Admit: 2022-09-17 | Discharge: 2022-09-17 | Disposition: A | Discharge status: Home / Self Care | Payer: Medicaid Other | Attending: Emergency Medicine | Admitting: Emergency Medicine

## 2022-09-17 ENCOUNTER — Ambulatory Visit: Payer: Medicaid Other | Admitting: Orthopedic Surgery

## 2022-09-17 ENCOUNTER — Encounter (HOSPITAL_BASED_OUTPATIENT_CLINIC_OR_DEPARTMENT_OTHER): Payer: Self-pay | Admitting: Urology

## 2022-09-17 DIAGNOSIS — M79602 Pain in left arm: Secondary | ICD-10-CM

## 2022-09-17 MED ORDER — OXYCODONE-ACETAMINOPHEN 5-325 MG PO TABS
2.0000 | ORAL_TABLET | Freq: Once | ORAL | Status: AC
  Administered 2022-09-17: 2 via ORAL
  Filled 2022-09-17: qty 2

## 2022-09-17 MED ORDER — HYDROCODONE-ACETAMINOPHEN 5-325 MG PO TABS: 1.0000 | ORAL_TABLET | Freq: Four times a day (QID) | ORAL | 0 refills | Status: DC | PRN

## 2022-09-17 MED ORDER — KETOROLAC TROMETHAMINE 60 MG/2ML IM SOLN
60.0000 mg | Freq: Once | INTRAMUSCULAR | Status: AC
  Administered 2022-09-17: 60 mg via INTRAMUSCULAR
  Filled 2022-09-17: qty 2

## 2022-09-17 NOTE — Discharge Instructions (Signed)
Begin taking hydrocodone as prescribed as needed for pain.  Follow-up with Dr. Eulah Pont in the next 1 to 2 days.
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
## 2022-09-17 NOTE — ED Notes (Signed)
ED Provider at bedside. 

## 2022-09-18 ENCOUNTER — Encounter: Payer: Self-pay | Admitting: Orthopedic Surgery

## 2022-09-18 ENCOUNTER — Ambulatory Visit (INDEPENDENT_AMBULATORY_CARE_PROVIDER_SITE_OTHER): Payer: Medicaid Other | Admitting: Orthopedic Surgery

## 2022-09-18 DIAGNOSIS — G5622 Lesion of ulnar nerve, left upper limb: Secondary | ICD-10-CM

## 2022-09-18 DIAGNOSIS — G5632 Lesion of radial nerve, left upper limb: Secondary | ICD-10-CM | POA: Diagnosis not present

## 2022-09-18 DIAGNOSIS — M25522 Pain in left elbow: Secondary | ICD-10-CM

## 2022-09-18 DIAGNOSIS — G5612 Other lesions of median nerve, left upper limb: Secondary | ICD-10-CM | POA: Diagnosis not present
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
## 2022-09-19 ENCOUNTER — Emergency Department (HOSPITAL_BASED_OUTPATIENT_CLINIC_OR_DEPARTMENT_OTHER)
Admission: EM | Admit: 2022-09-19 | Discharge: 2022-09-19 | Disposition: A | Discharge status: Home / Self Care | Payer: Medicaid Other | Attending: Emergency Medicine | Admitting: Emergency Medicine

## 2022-09-19 ENCOUNTER — Encounter (HOSPITAL_BASED_OUTPATIENT_CLINIC_OR_DEPARTMENT_OTHER): Payer: Self-pay

## 2022-09-19 DIAGNOSIS — M25522 Pain in left elbow: Secondary | ICD-10-CM | POA: Insufficient documentation

## 2022-09-19 DIAGNOSIS — M79602 Pain in left arm: Secondary | ICD-10-CM | POA: Diagnosis not present

## 2022-09-19 MED ORDER — LIDOCAINE 5 % EX PTCH: 1.0000 | MEDICATED_PATCH | CUTANEOUS | 0 refills | Status: DC

## 2022-09-19 MED ORDER — MELOXICAM 15 MG PO TABS: 15.0000 mg | ORAL_TABLET | Freq: Every day | ORAL | 0 refills | Status: DC

## 2022-09-19 MED ORDER — KETOROLAC TROMETHAMINE 60 MG/2ML IM SOLN
60.0000 mg | Freq: Once | INTRAMUSCULAR | Status: AC
  Administered 2022-09-19: 60 mg via INTRAMUSCULAR
  Filled 2022-09-19: qty 2

## 2022-09-19 MED ORDER — OXYCODONE HCL 5 MG PO TABS
5.0000 mg | ORAL_TABLET | Freq: Once | ORAL | Status: AC
  Administered 2022-09-19: 5 mg via ORAL
  Filled 2022-09-19: qty 1
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
## 2022-09-19 NOTE — Discharge Instructions (Addendum)
For your pain, you may take up to 1000mg  of acetaminophen (tylenol) 4 times daily for up to a week. This is the maximum dose of acetminophen (tylenol) you can take from all sources. Please check other over-the-counter medications and prescriptions to ensure you are not taking other medications that contain acetaminophen.  I have given you a prescription for meloxicam.  You can either take this daily OR you may also take ibuprofen 400 mg 6 times a day OR 600mg  4 times a day alternating with or at the same time as tylenol. Do not take meloxicam and ibuprofen together.

## 2022-09-21 ENCOUNTER — Telehealth: Payer: Self-pay

## 2022-09-21 ENCOUNTER — Telehealth: Payer: Self-pay | Admitting: *Deleted

## 2022-09-21 ENCOUNTER — Encounter (HOSPITAL_COMMUNITY): Payer: Self-pay | Admitting: Emergency Medicine

## 2022-09-21 ENCOUNTER — Ambulatory Visit (HOSPITAL_COMMUNITY)
Admission: EM | Admit: 2022-09-21 | Discharge: 2022-09-21 | Disposition: A | Discharge status: Home / Self Care | Payer: Medicaid Other | Attending: Emergency Medicine | Admitting: Emergency Medicine

## 2022-09-21 DIAGNOSIS — M79602 Pain in left arm: Secondary | ICD-10-CM | POA: Diagnosis not present

## 2022-09-21 MED ORDER — KETOROLAC TROMETHAMINE 30 MG/ML IJ SOLN
INTRAMUSCULAR | Status: AC
  Filled 2022-09-21: qty 1

## 2022-09-21 MED ORDER — IBUPROFEN 800 MG PO TABS: 800.0000 mg | ORAL_TABLET | Freq: Three times a day (TID) | ORAL | 0 refills | Status: DC

## 2022-09-21 MED ORDER — KETOROLAC TROMETHAMINE 30 MG/ML IJ SOLN
30.0000 mg | Freq: Once | INTRAMUSCULAR | Status: AC
  Administered 2022-09-21: 30 mg via INTRAMUSCULAR

## 2022-09-21 NOTE — Telephone Encounter (Signed)
Patient was advised to go to the ED concerning his left elbow due to not being able to be seen by any of the referring offices.  Patient voiced that he understands.

## 2022-09-21 NOTE — ED Provider Notes (Signed)
[facility] EMERGENCY DEPARTMENT Provider Note   CSN: [id number] Arrival date & time: 09/19/22  1235     History  Chief Complaint  Patient presents with   Arm Pain    Jay Weaver is a 36 y.o. male.  HPI     36yo male presents with concern for continued left elbow and arm pain.  He has been seen in the ED recently and yesterday saw Dr. [facility] regarding this.  Per Dr. 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   He has not had significant change in symptoms since his eveaauation with orthopedics yesterday, but notes continued and severe pain. This pain has been helped with toradol injection in the past and is requesting that today.  Denies fever, nausea, vomiting, other concerns.  Home Medications Prior to Admission medications   Medication Sig Start Date End Date Taking? Authorizing Provider  lidocaine (LIDODERM) 5 % Place 1 patch onto the skin daily. Remove & Discard patch within 12 hours or as directed by MD 09/19/22  Yes Alvira Monday, MD  HYDROcodone-acetaminophen (NORCO) 5-325 MG tablet Take 1-2 tablets by mouth every 6 (six) hours as needed. 09/17/22   Geoffery Lyons, MD  meloxicam (MOBIC) 15 MG tablet Take 1 tablet (15 mg total) by mouth daily for 10 days. 09/19/22 09/29/22  Alvira Monday, MD      Allergies    Penicillins    Review of Systems   Review of Systems  Physical Exam Updated Vital Signs BP (!) 164/102 (BP Location: Left Arm)   Pulse 99   Temp 98.2 F (36.8 C)   Resp 18   Ht 5\' 5"  (1.651 m)   Wt 90.7 kg   SpO2 96%   BMI 33.28 kg/m  Physical Exam Vitals and nursing note reviewed.  Constitutional:      General: He is not in acute distress.    Appearance: Normal appearance.  He is not ill-appearing, toxic-appearing or diaphoretic.  HENT:     Head: Normocephalic.  Eyes:     Conjunctiva/sclera: Conjunctivae normal.  Cardiovascular:     Rate and Rhythm: Normal rate and regular rhythm.     Pulses: Normal pulses.  Pulmonary:     Effort: Pulmonary effort is normal. No respiratory distress.  Musculoskeletal:        General: Tenderness (left arm, worse around elbow, present around hand, normla pulses) present. No deformity or signs of injury.     Cervical back: No rigidity.  Skin:    General: Skin is warm and dry.     Coloration: Skin is not jaundiced or pale.  Neurological:     General: No focal deficit present.     Mental Status: He is alert and oriented to person, place, and time.     ED Results / Procedures / Treatments   Labs (all labs ordered are listed, but only abnormal results are displayed) Labs Reviewed - No data to display  EKG None  Radiology No results found.  Procedures Procedures    Medications Ordered in ED Medications  ketorolac (TORADOL) injection 60 mg (60 mg Intramuscular Given 09/19/22 1452)  oxyCODONE (Oxy IR/ROXICODONE) immediate release tablet 5 mg (5 mg Oral Given 09/19/22 1454)    ED Course/ Medical Decision Making/ A&P  36yo male presents with concern for continued left elbow and arm pain.  He has been seen in the ED recently and yesterday saw Dr. [facility] regarding this.  Per Dr. 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   Has severe pain to left arm.  Normal pulses, doubt acute arterial thrombus, doubt DVT given history, exam, record review.   Suspect pain symptoms related to intramedullary abscess and nerve dysfunction as discussed with  Orthopedics yesterday. Given visit yesterday and no fever, do not suspect orthopedics will have change in plan to recommend emergent surgery today, ,however discussed it is important he continue to follow up with orthopedic providers and also recommend pcp and likely pain follow up.  Given lidoderm patch, dose of toradol. Patient discharged in stable condition with understanding of reasons to return.         Final Clinical Impression(s) / ED Diagnoses Final diagnoses:  Left arm pain    Rx / DC Orders ED Discharge Orders          Ordered    meloxicam (MOBIC) 15 MG tablet  Daily        09/19/22 1454    lidocaine (LIDODERM) 5 %  Every 24 hours        09/19/22 1454              Alvira Monday, MD 09/21/22 0025

## 2022-09-21 NOTE — Discharge Instructions (Addendum)
You received a Toradol injection in office today.  You may start taking the ibuprofen 800 mg tomorrow.  Please discontinue taking the meloxicam.

## 2022-09-21 NOTE — Patient Outreach (Signed)
  Care Coordination [facility] Note Transition Care Management Follow-up Telephone Call Date of discharge and from where: 09/19/22 from [facility] How have you been since you were released from the hospital? "I'm doing bad" Any questions or concerns? Yes-Patient feels like he is not getting adequate care.  Items Reviewed: Did the pt receive and understand the discharge instructions provided? Yes  Medications obtained and verified?  Patient did not receive Lidocaine Patch, RNCM contacted Pharmacy to inquire-not covered by Medicaid. Patient taking 3000 mg of acetaminophen every 6 hours. RNCM instructed patient to stop taking acetaminophen, explained the potential of overdose Other? Yes Patient has an 35 month old that he is unable to care for. His neighbor is helping out and has the infant at this time. He has contacted CPS, they are coming to his home today. Any new allergies since your discharge? No  Dietary orders reviewed? No Do you have support at home? No   Home Care and Equipment/Supplies: Were home health services ordered? no If so, what is the name of the agency? N/A  Has the agency set up a time to come to the patient's home? not applicable Were any new equipment or medical supplies ordered?  No What is the name of the medical supply agency? N/A Were you able to get the supplies/equipment? not applicable Do you have any questions related to the use of the equipment or supplies? No  Functional Questionnaire: (I = Independent and D = Dependent) ADLs: I  Bathing/Dressing- I  Meal Prep- I  Eating- I  Maintaining continence- I  Transferring/Ambulation- I  Managing Meds- I  Follow up appointments reviewed:  PCP Hospital f/u appt confirmed?  Patient does not have a PCP, RNCM provided patient with MD provider line [phone]  . Specialist Hospital f/u appt confirmed? Yes  Scheduled for MRI on 09/22/22 Are transportation arrangements needed? No  If their condition worsens, is the pt  aware to call PCP or go to the Emergency Dept.? Yes Was the patient provided with contact information for the PCP's office or ED? Yes Was to pt encouraged to call back with questions or concerns? Yes   RNCM advised patient to call 911 and return to the Emergency Room. Patient does not want to call 911. He has an MRI tomorrow and plans to attend the appointment. RNCM offered Case Management and LCSW services, call disconnected. RNCM called patient back patient declines Case Management services with RNCM or LCSW.  Jay Emms RN, BSN [place]  [employer]
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
## 2022-09-22 ENCOUNTER — Ambulatory Visit (HOSPITAL_COMMUNITY)
Admission: RE | Admit: 2022-09-22 | Discharge: 2022-09-22 | Disposition: A | Discharge status: Home / Self Care | Payer: Medicaid Other | Source: Ambulatory Visit | Attending: Orthopedic Surgery | Admitting: Orthopedic Surgery

## 2022-09-22 ENCOUNTER — Emergency Department (HOSPITAL_COMMUNITY)
Admission: EM | Admit: 2022-09-22 | Discharge: 2022-09-22 | Disposition: A | Discharge status: Home / Self Care | Payer: Medicaid Other | Attending: Emergency Medicine | Admitting: Emergency Medicine

## 2022-09-22 DIAGNOSIS — M869 Osteomyelitis, unspecified: Secondary | ICD-10-CM | POA: Diagnosis not present

## 2022-09-22 DIAGNOSIS — L905 Scar conditions and fibrosis of skin: Secondary | ICD-10-CM | POA: Diagnosis not present

## 2022-09-22 DIAGNOSIS — M79602 Pain in left arm: Secondary | ICD-10-CM | POA: Diagnosis not present

## 2022-09-22 DIAGNOSIS — M898X2 Other specified disorders of bone, upper arm: Secondary | ICD-10-CM | POA: Diagnosis not present

## 2022-09-22 LAB — COMPREHENSIVE METABOLIC PANEL
ALT: 26 U/L (ref 0–44)
AST: 25 U/L (ref 15–41)
Albumin: 3.8 g/dL (ref 3.5–5.0)
Alkaline Phosphatase: 64 U/L (ref 38–126)
Anion gap: 13 (ref 5–15)
BUN: 11 mg/dL (ref 6–20)
CO2: 21 mmol/L — ABNORMAL LOW (ref 22–32)
Calcium: 9.6 mg/dL (ref 8.9–10.3)
Chloride: 107 mmol/L (ref 98–111)
Creatinine, Ser: 1.08 mg/dL (ref 0.61–1.24)
GFR, Estimated: >60 mL/min (ref >60)
Glucose, Bld: 90 mg/dL (ref 70–99)
Potassium: 3.8 mmol/L (ref 3.5–5.1)
Sodium: 141 mmol/L (ref 135–145)
Total Bilirubin: 0.5 mg/dL (ref 0.3–1.2)
Total Protein: 7.3 g/dL (ref 6.5–8.1)

## 2022-09-22 LAB — CBC WITH DIFFERENTIAL/PLATELET
Abs Immature Granulocytes: 0.02 10*3/uL (ref 0.00–0.07)
Basophils Absolute: 0.1 10*3/uL (ref 0.0–0.1)
Basophils Relative: 1 %
Eosinophils Absolute: 0.2 10*3/uL (ref 0.0–0.5)
Eosinophils Relative: 2 %
HCT: 37.2 % — ABNORMAL LOW (ref 39.0–52.0)
Hemoglobin: 12.7 g/dL — ABNORMAL LOW (ref 13.0–17.0)
Immature Granulocytes: 0 %
Lymphocytes Relative: 28 %
Lymphs Abs: 2.5 10*3/uL (ref 0.7–4.0)
MCH: 30.2 pg (ref 26.0–34.0)
MCHC: 34.1 g/dL (ref 30.0–36.0)
MCV: 88.6 fL (ref 80.0–100.0)
Monocytes Absolute: 0.9 10*3/uL (ref 0.1–1.0)
Monocytes Relative: 10 %
Neutro Abs: 5.1 10*3/uL (ref 1.7–7.7)
Neutrophils Relative %: 59 %
Platelets: 274 10*3/uL (ref 150–400)
RBC: 4.2 MIL/uL — ABNORMAL LOW (ref 4.22–5.81)
RDW: 14.7 % (ref 11.5–15.5)
WBC: 8.8 10*3/uL (ref 4.0–10.5)
nRBC: 0 % (ref 0.0–0.2)

## 2022-09-22 LAB — LACTIC ACID, PLASMA: Lactic Acid, Venous: 0.7 mmol/L (ref 0.5–1.9)

## 2022-09-22 MED ORDER — GADOBUTROL 1 MMOL/ML IV SOLN
9.0000 mL | Freq: Once | INTRAVENOUS | Status: AC | PRN
  Administered 2022-09-22: 9 mL via INTRAVENOUS

## 2022-09-22 MED ORDER — KETOROLAC TROMETHAMINE 15 MG/ML IJ SOLN
15.0000 mg | Freq: Once | INTRAMUSCULAR | Status: AC
  Administered 2022-09-22: 15 mg via INTRAVENOUS
  Filled 2022-09-22: qty 1

## 2022-09-22 MED ORDER — OXYCODONE-ACETAMINOPHEN 5-325 MG PO TABS
2.0000 | ORAL_TABLET | Freq: Once | ORAL | Status: AC
  Administered 2022-09-22: 2 via ORAL
  Filled 2022-09-22: qty 2

## 2022-09-22 NOTE — ED Notes (Signed)
Pt refusing vital signs

## 2022-09-22 NOTE — ED Notes (Signed)
Pt left to go check on his car. Pt states he will be back.
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
## 2022-09-22 NOTE — ED Triage Notes (Addendum)
Patient here requesting antibiotics and pain medication for osteomyelitis in left upper arm. Patient is alert, oriented, and in no apparent distress at this time.

## 2022-09-23 ENCOUNTER — Emergency Department (HOSPITAL_COMMUNITY)
Admission: EM | Admit: 2022-09-23 | Discharge: 2022-09-23 | Disposition: A | Discharge status: Home / Self Care | Payer: Medicaid Other | Attending: Emergency Medicine | Admitting: Emergency Medicine

## 2022-09-23 DIAGNOSIS — M869 Osteomyelitis, unspecified: Secondary | ICD-10-CM | POA: Diagnosis not present

## 2022-09-23 DIAGNOSIS — M86542 Other chronic hematogenous osteomyelitis, left hand: Secondary | ICD-10-CM | POA: Insufficient documentation

## 2022-09-23 DIAGNOSIS — E1169 Type 2 diabetes mellitus with other specified complication: Secondary | ICD-10-CM | POA: Diagnosis not present

## 2022-09-23 DIAGNOSIS — M8660 Other chronic osteomyelitis, unspecified site: Secondary | ICD-10-CM

## 2022-09-23 DIAGNOSIS — M79602 Pain in left arm: Secondary | ICD-10-CM | POA: Diagnosis present

## 2022-09-23 MED ORDER — HYDROCODONE-ACETAMINOPHEN 5-325 MG PO TABS
1.0000 | ORAL_TABLET | Freq: Once | ORAL | Status: AC
  Administered 2022-09-23: 1 via ORAL
  Filled 2022-09-23: qty 1

## 2022-09-23 MED ORDER — KETOROLAC TROMETHAMINE 60 MG/2ML IM SOLN
60.0000 mg | Freq: Once | INTRAMUSCULAR | Status: AC
  Administered 2022-09-23: 60 mg via INTRAMUSCULAR
  Filled 2022-09-23: qty 2

## 2022-09-23 MED ORDER — HYDROCODONE-ACETAMINOPHEN 5-325 MG PO TABS: 1.0000 | ORAL_TABLET | ORAL | 0 refills | Status: DC | PRN

## 2022-09-23 MED ORDER — DOXYCYCLINE HYCLATE 100 MG PO TABS
100.0000 mg | ORAL_TABLET | Freq: Once | ORAL | Status: AC
  Administered 2022-09-23: 100 mg via ORAL
  Filled 2022-09-23: qty 1

## 2022-09-23 MED ORDER — DOXYCYCLINE HYCLATE 100 MG PO CAPS: 100.0000 mg | ORAL_CAPSULE | Freq: Two times a day (BID) | ORAL | 0 refills | Status: DC

## 2022-09-23 NOTE — ED Triage Notes (Signed)
Pt will not tell us what he is here for telling us to look at what he came for yesterday.

## 2022-09-23 NOTE — Discharge Instructions (Addendum)
Please take your antibiotics as directed until finished.  Use Norco for pain, do not take additional Tylenol with this.  You can also continue to take Motrin.  Please call Dr. Everardo Pacific who's contact information if provided in your discharge paperwork wanted to schedule an appointment.  Return to the ER for any new or worsening symptoms.

## 2022-09-23 NOTE — ED Provider Notes (Signed)
[name] EMERGENCY DEPARTMENT Provider Note   CSN: [id number] Arrival date & time: 09/23/22  1159     History  Chief Complaint  Patient presents with   will not give Weaver reason why he is here     Jay Weaver is Weaver 36 y.o. male.  HPI 36 year old male who was seen here several times in the last week including yesterday with chronic osteomyelitis of the left arm and hand.  The patient had an MRI done yesterday which confirmed this, reviewed work-up from prior provider.  There was discussion with multiple specialists including orthopedics, infectious disease.  Orthopedic/ID did not feel that the patient needed admission for intravenous antibiotics and recommended outpatient doxycycline with follow-up with Jay Weaver with orthopedics.  The patient was teed up for discharge however he had eloped prior to receiving any information about his antibiotics or follow-up information.  He returns today with complaints of pain to the left arm.  States he eloped yesterday because he "needed to be at work".  He denies any fevers or chills.  Has been taking Motrin for pain.    Home Medications Prior to Admission medications   Medication Sig Start Date End Date Taking? Authorizing Provider  doxycycline (VIBRAMYCIN) 100 MG capsule Take 1 capsule (100 mg total) by mouth 2 (two) times daily for 14 days. 09/23/22 10/07/22 Yes Jay Ferrari, PA-C  HYDROcodone-acetaminophen (NORCO/VICODIN) 5-325 MG tablet Take 1 tablet by mouth every 4 (four) hours as needed for up to 3 days. 09/23/22 09/26/22 Yes Jay Ferrari, PA-C  acetaminophen (TYLENOL) 500 MG tablet Take 500 mg by mouth every 6 (six) hours as needed.    [provider]  ibuprofen (ADVIL) 800 MG tablet Take 1 tablet (800 mg total) by mouth 3 (three) times daily. 09/21/22   Debby Freiberg, NP  lidocaine (LIDODERM) 5 % Place 1 patch onto the skin daily. Remove & Discard patch within 12 hours or as directed by  MD Patient not taking: Reported on 09/21/2022 09/19/22   Alvira Monday, MD      Allergies    Penicillins    Review of Systems   Review of Systems Ten systems reviewed and are negative for acute change, except as noted in the HPI.   Physical Exam Updated Vital Signs BP (!) 160/110 (BP Location: Left Wrist)   Pulse 85   Temp 98 F (36.7 C) (Oral)   Resp 18   SpO2 100%  Physical Exam Vitals and nursing note reviewed.  Constitutional:      General: He is not in acute distress.    Appearance: He is well-developed.  HENT:     Head: Normocephalic and atraumatic.  Eyes:     Conjunctiva/sclera: Conjunctivae normal.  Cardiovascular:     Rate and Rhythm: Normal rate and regular rhythm.     Heart sounds: No murmur heard. Pulmonary:     Effort: Pulmonary effort is normal. No respiratory distress.     Breath sounds: Normal breath sounds.  Abdominal:     Palpations: Abdomen is soft.     Tenderness: There is no abdominal tenderness.  Musculoskeletal:        General: No swelling.     Cervical back: Neck supple.     Comments: Supple old surgical scars present in the left upper extremity.  Tenderness palpation of the left upper extremity, no redness heat or swelling.  He is able to move the shoulder elbow and wrist but has pain with left elbow  movement.  Radial pulse intact.   Skin:    General: Skin is warm and dry.     Capillary Refill: Capillary refill takes less than 2 seconds.  Neurological:     Mental Status: He is alert.  Psychiatric:        Mood and Affect: Mood normal.     ED Results / Procedures / Treatments   Labs (all labs ordered are listed, but only abnormal results are displayed) Labs Reviewed - No data to display  EKG None  Radiology No results found.  Procedures Procedures    Medications Ordered in ED Medications  doxycycline (VIBRA-TABS) tablet 100 mg (100 mg Oral Given 09/23/22 1351)  ketorolac (TORADOL) injection 60 mg (60 mg Intramuscular Given  09/23/22 1352)  HYDROcodone-acetaminophen (NORCO/VICODIN) 5-325 MG per tablet 1 tablet (1 tablet Oral Given 09/23/22 1351)    ED Course/ Medical Decision Making/ Weaver&P                           Medical Decision Making Risk Prescription drug management.  Patient presenting with known osteomyelitis of the left forearm.  Eloped yesterday prior to receiving follow-up information which included doxycycline until surgery follow-up and follow-up with Jay Weaver.  He was given Weaver dose of the antibiotic here today as well as Toradol. I reviewed his PDMP and it is appropriate, will give several days of Norco for breakthrough pain.  He was given information for follow-up with Jay Weaver, discussed return precautions.  Initially tachycardic on arrival however this resolved upon recheck.  Stable for discharge. Final Clinical Impression(s) / ED Diagnoses Final diagnoses:  Chronic osteomyelitis ([place])    Rx / DC Orders ED Discharge Orders          Ordered    doxycycline (VIBRAMYCIN) 100 MG capsule  2 times daily        09/23/22 1337    HYDROcodone-acetaminophen (NORCO/VICODIN) 5-325 MG tablet  Every 4 hours PRN        09/23/22 [place], Jay [place] A, PA-C 09/23/22 1433    Blanchie Dessert, MD 09/24/22 1826

## 2022-09-23 NOTE — ED Notes (Signed)
Pt is extremely rude will not give this RN a reason why he came. Pt telling this RN he needs to communicate with the staff out front and figure it out. Pt also states you need to look back at why I was here yesterday and you can figure it out.
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
## 2022-09-25 ENCOUNTER — Telehealth: Payer: Self-pay | Admitting: *Deleted

## 2022-09-25 NOTE — Patient Outreach (Signed)
  Care Coordination [facility] Note Transition Care Management Follow-up Telephone Call Date of discharge and from where: 09/23/22 from [facility] ED How have you been since you were released from the hospital? Patient reports feeling worse since discharge Any questions or concerns? Yes  Items Reviewed: Did the pt receive and understand the discharge instructions provided? Yes  Medications obtained and verified? Yes  Other? Yes  Any new allergies since your discharge? No  Dietary orders reviewed? Yes Do you have support at home? No   Home Care and Equipment/Supplies: Were home health services ordered? no If so, what is the name of the agency? N/A  Has the agency set up a time to come to the patient's home? not applicable Were any new equipment or medical supplies ordered?  No What is the name of the medical supply agency? N/A Were you able to get the supplies/equipment? not applicable Do you have any questions related to the use of the equipment or supplies? No  Functional Questionnaire: (I = Independent and D = Dependent) ADLs: I  Bathing/Dressing- I  Meal Prep- I  Eating- I  Maintaining continence- I  Transferring/Ambulation- I  Managing Meds- I  Follow up appointments reviewed:  PCP Hospital f/u appt confirmed? No  Patient does not have a PCP and does not want help scheduling a new patient visit Specialist Hospital f/u appt confirmed? Yes  Scheduled to see Dr. Everardo Pacific on 09/26/22 @ 12:15pm. Are transportation arrangements needed? No  If their condition worsens, is the pt aware to call PCP or go to the Emergency Dept.? Yes Was the patient provided with contact information for the PCP's office or ED? No Was to pt encouraged to call back with questions or concerns? Yes  Patient became verbally aggressive when asked about a PCP. Asking why this caller so concerned about him. 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 RNCM attempted  to explain that it takes antibiotics a few days to take care of the infection and the call was disconnected by the patient.  Jay Emms RN, BSN [place]  [employer]

## 2022-09-26 ENCOUNTER — Inpatient Hospital Stay (HOSPITAL_COMMUNITY): Payer: Medicaid Other | Admitting: Certified Registered"

## 2022-09-26 ENCOUNTER — Inpatient Hospital Stay: Admit: 2022-09-26 | Payer: Medicaid Other | Admitting: Orthopedic Surgery

## 2022-09-26 ENCOUNTER — Inpatient Hospital Stay (HOSPITAL_COMMUNITY)
Admission: EM | Admit: 2022-09-26 | Discharge: 2022-09-29 | DRG: 493 | Disposition: A | Discharge status: Home / Self Care | Payer: Medicaid Other | Attending: Internal Medicine | Admitting: Internal Medicine

## 2022-09-26 ENCOUNTER — Encounter (HOSPITAL_COMMUNITY)
Admission: EM | Disposition: A | Discharge status: Home / Self Care | Payer: Self-pay | Source: Home / Self Care | Attending: Internal Medicine

## 2022-09-26 ENCOUNTER — Other Ambulatory Visit: Payer: Self-pay

## 2022-09-26 ENCOUNTER — Encounter (HOSPITAL_COMMUNITY): Payer: Self-pay | Admitting: Internal Medicine

## 2022-09-26 DIAGNOSIS — L0291 Cutaneous abscess, unspecified: Secondary | ICD-10-CM

## 2022-09-26 DIAGNOSIS — R112 Nausea with vomiting, unspecified: Secondary | ICD-10-CM

## 2022-09-26 DIAGNOSIS — M86122 Other acute osteomyelitis, left humerus: Secondary | ICD-10-CM | POA: Diagnosis not present

## 2022-09-26 DIAGNOSIS — M009 Pyogenic arthritis, unspecified: Secondary | ICD-10-CM | POA: Diagnosis not present

## 2022-09-26 DIAGNOSIS — F17290 Nicotine dependence, other tobacco product, uncomplicated: Secondary | ICD-10-CM | POA: Diagnosis not present

## 2022-09-26 DIAGNOSIS — W3400XD Accidental discharge from unspecified firearms or gun, subsequent encounter: Secondary | ICD-10-CM

## 2022-09-26 DIAGNOSIS — M86622 Other chronic osteomyelitis, left  humerus: Secondary | ICD-10-CM | POA: Diagnosis not present

## 2022-09-26 DIAGNOSIS — E876 Hypokalemia: Secondary | ICD-10-CM | POA: Diagnosis not present

## 2022-09-26 DIAGNOSIS — Z720 Tobacco use: Secondary | ICD-10-CM

## 2022-09-26 DIAGNOSIS — F419 Anxiety disorder, unspecified: Secondary | ICD-10-CM | POA: Diagnosis present

## 2022-09-26 DIAGNOSIS — R197 Diarrhea, unspecified: Secondary | ICD-10-CM

## 2022-09-26 DIAGNOSIS — M868X9 Other osteomyelitis, unspecified sites: Secondary | ICD-10-CM | POA: Diagnosis not present

## 2022-09-26 DIAGNOSIS — Z886 Allergy status to analgesic agent status: Secondary | ICD-10-CM

## 2022-09-26 DIAGNOSIS — B9561 Methicillin susceptible Staphylococcus aureus infection as the cause of diseases classified elsewhere: Secondary | ICD-10-CM | POA: Diagnosis present

## 2022-09-26 DIAGNOSIS — M86129 Other acute osteomyelitis, unspecified humerus: Principal | ICD-10-CM

## 2022-09-26 DIAGNOSIS — N179 Acute kidney failure, unspecified: Secondary | ICD-10-CM | POA: Diagnosis not present

## 2022-09-26 DIAGNOSIS — Z880 Allergy status to penicillin: Secondary | ICD-10-CM

## 2022-09-26 DIAGNOSIS — M869 Osteomyelitis, unspecified: Secondary | ICD-10-CM

## 2022-09-26 DIAGNOSIS — F4310 Post-traumatic stress disorder, unspecified: Secondary | ICD-10-CM | POA: Diagnosis not present

## 2022-09-26 DIAGNOSIS — R609 Edema, unspecified: Secondary | ICD-10-CM | POA: Diagnosis not present

## 2022-09-26 DIAGNOSIS — F1910 Other psychoactive substance abuse, uncomplicated: Secondary | ICD-10-CM | POA: Diagnosis present

## 2022-09-26 DIAGNOSIS — M79602 Pain in left arm: Secondary | ICD-10-CM | POA: Diagnosis present

## 2022-09-26 DIAGNOSIS — M86022 Acute hematogenous osteomyelitis, left humerus: Secondary | ICD-10-CM | POA: Diagnosis not present

## 2022-09-26 DIAGNOSIS — W3400XA Accidental discharge from unspecified firearms or gun, initial encounter: Secondary | ICD-10-CM

## 2022-09-26 DIAGNOSIS — M79642 Pain in left hand: Secondary | ICD-10-CM | POA: Diagnosis not present

## 2022-09-26 DIAGNOSIS — Z791 Long term (current) use of non-steroidal anti-inflammatories (NSAID): Secondary | ICD-10-CM | POA: Diagnosis not present

## 2022-09-26 DIAGNOSIS — D649 Anemia, unspecified: Secondary | ICD-10-CM | POA: Diagnosis not present

## 2022-09-26 DIAGNOSIS — M87822 Other osteonecrosis, left humerus: Secondary | ICD-10-CM | POA: Diagnosis not present

## 2022-09-26 HISTORY — PX: INCISION AND DRAINAGE OF WOUND: SHX1803

## 2022-09-26 LAB — CBC WITH DIFFERENTIAL/PLATELET
Abs Immature Granulocytes: 0.03 10*3/uL (ref 0.00–0.07)
Basophils Absolute: 0.1 10*3/uL (ref 0.0–0.1)
Basophils Relative: 1 %
Eosinophils Absolute: 0.1 10*3/uL (ref 0.0–0.5)
Eosinophils Relative: 2 %
HCT: 35 % — ABNORMAL LOW (ref 39.0–52.0)
Hemoglobin: 12.2 g/dL — ABNORMAL LOW (ref 13.0–17.0)
Immature Granulocytes: 0 %
Lymphocytes Relative: 27 %
Lymphs Abs: 2.4 10*3/uL (ref 0.7–4.0)
MCH: 31 pg (ref 26.0–34.0)
MCHC: 34.9 g/dL (ref 30.0–36.0)
MCV: 89.1 fL (ref 80.0–100.0)
Monocytes Absolute: 0.6 10*3/uL (ref 0.1–1.0)
Monocytes Relative: 7 %
Neutro Abs: 5.6 10*3/uL (ref 1.7–7.7)
Neutrophils Relative %: 63 %
Platelets: 328 10*3/uL (ref 150–400)
RBC: 3.93 MIL/uL — ABNORMAL LOW (ref 4.22–5.81)
RDW: 14.6 % (ref 11.5–15.5)
WBC: 8.8 10*3/uL (ref 4.0–10.5)
nRBC: 0 % (ref 0.0–0.2)

## 2022-09-26 LAB — TYPE AND SCREEN
ABO/RH(D): A POS
Antibody Screen: NEGATIVE

## 2022-09-26 LAB — COMPREHENSIVE METABOLIC PANEL
ALT: 15 U/L (ref 0–44)
AST: 21 U/L (ref 15–41)
Albumin: 3.7 g/dL (ref 3.5–5.0)
Alkaline Phosphatase: 61 U/L (ref 38–126)
Anion gap: 12 (ref 5–15)
BUN: 16 mg/dL (ref 6–20)
CO2: 18 mmol/L — ABNORMAL LOW (ref 22–32)
Calcium: 9.1 mg/dL (ref 8.9–10.3)
Chloride: 110 mmol/L (ref 98–111)
Creatinine, Ser: 1.94 mg/dL — ABNORMAL HIGH (ref 0.61–1.24)
GFR, Estimated: 46 mL/min — ABNORMAL LOW (ref >60)
Glucose, Bld: 144 mg/dL — ABNORMAL HIGH (ref 70–99)
Potassium: 3.5 mmol/L (ref 3.5–5.1)
Sodium: 140 mmol/L (ref 135–145)
Total Bilirubin: 0.3 mg/dL (ref 0.3–1.2)
Total Protein: 7 g/dL (ref 6.5–8.1)

## 2022-09-26 LAB — APTT: aPTT: 31 seconds (ref 24–36)

## 2022-09-26 LAB — PROTIME-INR
INR: 1 (ref 0.8–1.2)
Prothrombin Time: 13.1 seconds (ref 11.4–15.2)

## 2022-09-26 LAB — LACTIC ACID, PLASMA
Lactic Acid, Venous: 0.7 mmol/L (ref 0.5–1.9)
Lactic Acid, Venous: 0.7 mmol/L (ref 0.5–1.9)

## 2022-09-26 LAB — ABO/RH: ABO/RH(D): A POS

## 2022-09-26 LAB — HIV ANTIBODY (ROUTINE TESTING W REFLEX): HIV Screen 4th Generation wRfx: NONREACTIVE

## 2022-09-26 SURGERY — IRRIGATION AND DEBRIDEMENT WOUND
Anesthesia: General | Site: Elbow | Laterality: Left

## 2022-09-26 MED ORDER — SUCCINYLCHOLINE CHLORIDE 200 MG/10ML IV SOSY
PREFILLED_SYRINGE | INTRAVENOUS | Status: DC | PRN
  Administered 2022-09-26: 120 mg via INTRAVENOUS

## 2022-09-26 MED ORDER — ROCURONIUM BROMIDE 100 MG/10ML IV SOLN
INTRAVENOUS | Status: DC | PRN
  Administered 2022-09-26: 50 mg via INTRAVENOUS

## 2022-09-26 MED ORDER — ONDANSETRON HCL 4 MG/2ML IJ SOLN
INTRAMUSCULAR | Status: DC | PRN
  Administered 2022-09-26: 4 mg via INTRAVENOUS

## 2022-09-26 MED ORDER — MIDAZOLAM HCL 2 MG/2ML IJ SOLN
INTRAMUSCULAR | Status: AC
  Filled 2022-09-26: qty 2

## 2022-09-26 MED ORDER — ONDANSETRON HCL 4 MG PO TABS: 4.0000 mg | ORAL_TABLET | Freq: Four times a day (QID) | ORAL | Status: DC | PRN

## 2022-09-26 MED ORDER — LACTATED RINGERS IV BOLUS
1000.0000 mL | Freq: Once | INTRAVENOUS | Status: AC
  Administered 2022-09-26: 1000 mL via INTRAVENOUS

## 2022-09-26 MED ORDER — DEXMEDETOMIDINE HCL IN NACL 80 MCG/20ML IV SOLN
INTRAVENOUS | Status: DC | PRN
  Administered 2022-09-26: 20 ug via BUCCAL
  Administered 2022-09-26 (×2): 8 ug via BUCCAL

## 2022-09-26 MED ORDER — NICOTINE 21 MG/24HR TD PT24
21.0000 mg | MEDICATED_PATCH | Freq: Every day | TRANSDERMAL | Status: DC
  Administered 2022-09-26: 21 mg via TRANSDERMAL
  Filled 2022-09-26 (×3): qty 1

## 2022-09-26 MED ORDER — SODIUM CHLORIDE 0.9 % IR SOLN
Status: DC | PRN
  Administered 2022-09-26: 1000 mL

## 2022-09-26 MED ORDER — FENTANYL CITRATE (PF) 250 MCG/5ML IJ SOLN
INTRAMUSCULAR | Status: AC
  Filled 2022-09-26: qty 5

## 2022-09-26 MED ORDER — SUGAMMADEX SODIUM 200 MG/2ML IV SOLN
INTRAVENOUS | Status: DC | PRN
  Administered 2022-09-26: 200 mg via INTRAVENOUS

## 2022-09-26 MED ORDER — SODIUM CHLORIDE 0.9 % IV SOLN
1.0000 g | Freq: Once | INTRAVENOUS | Status: AC
  Administered 2022-09-26: 1 g via INTRAVENOUS
  Filled 2022-09-26: qty 10

## 2022-09-26 MED ORDER — ENOXAPARIN SODIUM 40 MG/0.4ML IJ SOSY
40.0000 mg | PREFILLED_SYRINGE | INTRAMUSCULAR | Status: DC
  Administered 2022-09-26 – 2022-09-28 (×3): 40 mg via SUBCUTANEOUS
  Filled 2022-09-26 (×3): qty 0.4

## 2022-09-26 MED ORDER — DEXMEDETOMIDINE HCL IN NACL 80 MCG/20ML IV SOLN
INTRAVENOUS | Status: AC
  Filled 2022-09-26: qty 20

## 2022-09-26 MED ORDER — PROPOFOL 10 MG/ML IV BOLUS
INTRAVENOUS | Status: AC
  Filled 2022-09-26: qty 20

## 2022-09-26 MED ORDER — HYDROCODONE-ACETAMINOPHEN 5-325 MG PO TABS
1.0000 | ORAL_TABLET | Freq: Four times a day (QID) | ORAL | Status: DC | PRN
  Administered 2022-09-26: 1 via ORAL
  Filled 2022-09-26: qty 1

## 2022-09-26 MED ORDER — HYDRALAZINE HCL 20 MG/ML IJ SOLN: 10.0000 mg | INTRAMUSCULAR | Status: DC | PRN

## 2022-09-26 MED ORDER — SODIUM CHLORIDE 0.9 % IR SOLN
Status: DC | PRN
  Administered 2022-09-26: 3000 mL

## 2022-09-26 MED ORDER — ONDANSETRON HCL 4 MG/2ML IJ SOLN
INTRAMUSCULAR | Status: AC
  Filled 2022-09-26: qty 2

## 2022-09-26 MED ORDER — SODIUM CHLORIDE 0.9% FLUSH
3.0000 mL | Freq: Two times a day (BID) | INTRAVENOUS | Status: DC
  Administered 2022-09-27 – 2022-09-29 (×5): 3 mL via INTRAVENOUS

## 2022-09-26 MED ORDER — LACTATED RINGERS IV SOLN: INTRAVENOUS | Status: DC | PRN

## 2022-09-26 MED ORDER — FENTANYL CITRATE (PF) 100 MCG/2ML IJ SOLN
INTRAMUSCULAR | Status: AC
  Filled 2022-09-26: qty 2

## 2022-09-26 MED ORDER — LIDOCAINE 2% (20 MG/ML) 5 ML SYRINGE
INTRAMUSCULAR | Status: AC
  Filled 2022-09-26: qty 5

## 2022-09-26 MED ORDER — ACETAMINOPHEN 325 MG PO TABS
650.0000 mg | ORAL_TABLET | Freq: Four times a day (QID) | ORAL | Status: DC | PRN
  Administered 2022-09-26 – 2022-09-28 (×4): 650 mg via ORAL
  Filled 2022-09-26 (×4): qty 2

## 2022-09-26 MED ORDER — KETOROLAC TROMETHAMINE 30 MG/ML IJ SOLN
30.0000 mg | Freq: Once | INTRAMUSCULAR | Status: AC
  Administered 2022-09-26: 30 mg via INTRAMUSCULAR
  Filled 2022-09-26: qty 1

## 2022-09-26 MED ORDER — ONDANSETRON HCL 4 MG/2ML IJ SOLN
4.0000 mg | Freq: Once | INTRAMUSCULAR | Status: AC
  Administered 2022-09-26: 4 mg via INTRAVENOUS
  Filled 2022-09-26: qty 2

## 2022-09-26 MED ORDER — SODIUM CHLORIDE 0.9 % IV SOLN: INTRAVENOUS | Status: DC

## 2022-09-26 MED ORDER — SUCCINYLCHOLINE CHLORIDE 200 MG/10ML IV SOSY
PREFILLED_SYRINGE | INTRAVENOUS | Status: AC
  Filled 2022-09-26: qty 10

## 2022-09-26 MED ORDER — VANCOMYCIN HCL 500 MG IV SOLR
INTRAVENOUS | Status: DC | PRN
  Administered 2022-09-26: 500 mg via TOPICAL

## 2022-09-26 MED ORDER — ACETAMINOPHEN 650 MG RE SUPP: 650.0000 mg | Freq: Four times a day (QID) | RECTAL | Status: DC | PRN

## 2022-09-26 MED ORDER — ALBUTEROL SULFATE (2.5 MG/3ML) 0.083% IN NEBU: 2.5000 mg | INHALATION_SOLUTION | Freq: Four times a day (QID) | RESPIRATORY_TRACT | Status: DC | PRN

## 2022-09-26 MED ORDER — LIDOCAINE HCL (CARDIAC) PF 100 MG/5ML IV SOSY
PREFILLED_SYRINGE | INTRAVENOUS | Status: DC | PRN
  Administered 2022-09-26: 60 mg via INTRAVENOUS

## 2022-09-26 MED ORDER — VANCOMYCIN HCL 1000 MG IV SOLR
INTRAVENOUS | Status: DC | PRN
  Administered 2022-09-26: 1000 mg via INTRAVENOUS

## 2022-09-26 MED ORDER — MORPHINE SULFATE (PF) 4 MG/ML IV SOLN
4.0000 mg | Freq: Once | INTRAVENOUS | Status: AC
  Administered 2022-09-26: 4 mg via INTRAVENOUS
  Filled 2022-09-26: qty 1

## 2022-09-26 MED ORDER — MIDAZOLAM HCL 5 MG/5ML IJ SOLN
INTRAMUSCULAR | Status: DC | PRN
  Administered 2022-09-26 – 2022-09-27 (×2): 2 mg via INTRAVENOUS

## 2022-09-26 MED ORDER — PROPOFOL 10 MG/ML IV BOLUS
INTRAVENOUS | Status: DC | PRN
  Administered 2022-09-26: 170 mg via INTRAVENOUS

## 2022-09-26 MED ORDER — FENTANYL CITRATE (PF) 100 MCG/2ML IJ SOLN
INTRAMUSCULAR | Status: DC | PRN
  Administered 2022-09-26: 50 ug via INTRAVENOUS
  Administered 2022-09-26: 100 ug via INTRAVENOUS
  Administered 2022-09-26: 50 ug via INTRAVENOUS
  Administered 2022-09-26: 100 ug via INTRAVENOUS
  Administered 2022-09-26: 50 ug via INTRAVENOUS
  Administered 2022-09-26: 100 ug via INTRAVENOUS

## 2022-09-26 MED ORDER — FENTANYL CITRATE (PF) 100 MCG/2ML IJ SOLN
25.0000 ug | INTRAMUSCULAR | Status: DC | PRN
  Administered 2022-09-27: 50 ug via INTRAVENOUS

## 2022-09-26 MED ORDER — VANCOMYCIN HCL 500 MG IV SOLR
INTRAVENOUS | Status: AC
  Filled 2022-09-26: qty 10

## 2022-09-26 MED ORDER — ONDANSETRON HCL 4 MG/2ML IJ SOLN
4.0000 mg | Freq: Four times a day (QID) | INTRAMUSCULAR | Status: DC | PRN
  Administered 2022-09-28: 4 mg via INTRAVENOUS
  Filled 2022-09-26: qty 2

## 2022-09-26 MED ORDER — VANCOMYCIN HCL IN DEXTROSE 1-5 GM/200ML-% IV SOLN
INTRAVENOUS | Status: AC
  Filled 2022-09-26: qty 200

## 2022-09-26 SURGICAL SUPPLY — 72 items
ALCOHOL 70% 16 OZ (MISCELLANEOUS) ×1 IMPLANT
BAG COUNTER SPONGE SURGICOUNT (BAG) ×1 IMPLANT
BLADE SURG 10 STRL SS (BLADE) ×1 IMPLANT
BNDG COHESIVE 1X5 TAN STRL LF (GAUZE/BANDAGES/DRESSINGS) IMPLANT
BNDG COHESIVE 4X5 TAN STRL (GAUZE/BANDAGES/DRESSINGS) ×1 IMPLANT
BNDG COHESIVE 6X5 TAN STRL LF (GAUZE/BANDAGES/DRESSINGS) ×2 IMPLANT
BNDG CONFORM 3 STRL LF (GAUZE/BANDAGES/DRESSINGS) IMPLANT
BNDG ELASTIC 3X5.8 VLCR STR LF (GAUZE/BANDAGES/DRESSINGS) IMPLANT
BNDG ELASTIC 4X5.8 VLCR STR LF (GAUZE/BANDAGES/DRESSINGS) IMPLANT
BNDG GAUZE DERMACEA FLUFF 4 (GAUZE/BANDAGES/DRESSINGS) ×3 IMPLANT
BUR EGG ELITE 4.0 (BURR) IMPLANT
CORD BIPOLAR FORCEPS 12FT (ELECTRODE) IMPLANT
COVER SURGICAL LIGHT HANDLE (MISCELLANEOUS) ×1 IMPLANT
CUFF TOURN SGL QUICK 24 (TOURNIQUET CUFF)
CUFF TOURN SGL QUICK 34 (TOURNIQUET CUFF) ×2
CUFF TOURN SGL QUICK 42 (TOURNIQUET CUFF) IMPLANT
CUFF TRNQT CYL 24X4X16.5-23 (TOURNIQUET CUFF) IMPLANT
CUFF TRNQT CYL 34X4.125X (TOURNIQUET CUFF) ×2 IMPLANT
DRAPE EXTREMITY BILATERAL (DRAPES) IMPLANT
DRAPE IMP U-DRAPE 54X76 (DRAPES) IMPLANT
DRAPE INCISE IOBAN 66X45 STRL (DRAPES) ×4 IMPLANT
DRAPE SURG 17X23 STRL (DRAPES) IMPLANT
DRAPE U-SHAPE 47X51 STRL (DRAPES) ×1 IMPLANT
DRSG ADAPTIC 3X8 NADH LF (GAUZE/BANDAGES/DRESSINGS) IMPLANT
DURAPREP 26ML APPLICATOR (WOUND CARE) ×1 IMPLANT
ELECT CAUTERY BLADE 6.4 (BLADE) ×1 IMPLANT
ELECT REM PT RETURN 9FT ADLT (ELECTROSURGICAL)
ELECTRODE REM PT RTRN 9FT ADLT (ELECTROSURGICAL) IMPLANT
FACESHIELD WRAPAROUND (MASK) IMPLANT
FACESHIELD WRAPAROUND OR TEAM (MASK) IMPLANT
GAUZE PAD ABD 8X10 STRL (GAUZE/BANDAGES/DRESSINGS) ×1 IMPLANT
GAUZE SPONGE 4X4 12PLY STRL (GAUZE/BANDAGES/DRESSINGS) ×2 IMPLANT
GAUZE XEROFORM 1X8 LF (GAUZE/BANDAGES/DRESSINGS) ×1 IMPLANT
GAUZE XEROFORM 5X9 LF (GAUZE/BANDAGES/DRESSINGS) ×1 IMPLANT
GLOVE BIO SURGEON STRL SZ7.5 (GLOVE) ×2 IMPLANT
GLOVE BIOGEL PI IND STRL 8 (GLOVE) ×2 IMPLANT
GOWN STRL REUS W/ TWL LRG LVL3 (GOWN DISPOSABLE) ×2 IMPLANT
GOWN STRL REUS W/ TWL XL LVL3 (GOWN DISPOSABLE) ×2 IMPLANT
GOWN STRL REUS W/TWL LRG LVL3 (GOWN DISPOSABLE) ×2
GOWN STRL REUS W/TWL XL LVL3 (GOWN DISPOSABLE) ×2
HANDPIECE INTERPULSE COAX TIP (DISPOSABLE)
KIT BASIN OR (CUSTOM PROCEDURE TRAY) ×1 IMPLANT
KIT TURNOVER KIT B (KITS) ×1 IMPLANT
MANIFOLD NEPTUNE II (INSTRUMENTS) ×1 IMPLANT
NS IRRIG 1000ML POUR BTL (IV SOLUTION) ×2 IMPLANT
PACK ORTHO EXTREMITY (CUSTOM PROCEDURE TRAY) ×1 IMPLANT
PAD ABD 8X10 STRL (GAUZE/BANDAGES/DRESSINGS) IMPLANT
PAD ARMBOARD 7.5X6 YLW CONV (MISCELLANEOUS) ×2 IMPLANT
PAD CAST 4YDX4 CTTN HI CHSV (CAST SUPPLIES) IMPLANT
PADDING CAST ABS COTTON 4X4 ST (CAST SUPPLIES) ×2 IMPLANT
PADDING CAST COTTON 4X4 STRL (CAST SUPPLIES) ×2
PADDING CAST COTTON 6X4 STRL (CAST SUPPLIES) ×1 IMPLANT
SET CYSTO W/LG BORE CLAMP LF (SET/KITS/TRAYS/PACK) ×1 IMPLANT
SET HNDPC FAN SPRY TIP SCT (DISPOSABLE) IMPLANT
SPONGE T-LAP 18X18 ~~LOC~~+RFID (SPONGE) ×2 IMPLANT
STOCKINETTE IMPERVIOUS 9X36 MD (GAUZE/BANDAGES/DRESSINGS) ×1 IMPLANT
SUT ETHILON 2 0 FS 18 (SUTURE) IMPLANT
SUT ETHILON 2 0 PSLX (SUTURE) IMPLANT
SUT ETHILON 3 0 PS 1 (SUTURE) IMPLANT
SUT MNCRL AB 3-0 PS2 18 (SUTURE) IMPLANT
SUT MON AB 2-0 CT2 27 (SUTURE) IMPLANT
SUT VIC AB 2-0 CT1 36 (SUTURE) IMPLANT
SUT VIC AB 2-0 FS1 27 (SUTURE) IMPLANT
SWAB CULTURE ESWAB REG 1ML (MISCELLANEOUS) IMPLANT
SYR CONTROL 10ML LL (SYRINGE) IMPLANT
TOWEL GREEN STERILE (TOWEL DISPOSABLE) ×1 IMPLANT
TOWEL GREEN STERILE FF (TOWEL DISPOSABLE) ×1 IMPLANT
TUBE CONNECTING 12X1/4 (SUCTIONS) ×1 IMPLANT
TUBE FEEDING ENTERAL 5FR 16IN (TUBING) IMPLANT
UNDERPAD 30X36 HEAVY ABSORB (UNDERPADS AND DIAPERS) ×2 IMPLANT
WATER STERILE IRR 1000ML POUR (IV SOLUTION) ×1 IMPLANT
YANKAUER SUCT BULB TIP NO VENT (SUCTIONS) ×1 IMPLANT
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
## 2022-09-26 NOTE — ED Notes (Signed)
Spoke with Jay Weaver from [facility] who states patient needs to be npo for at least 8 hr before going to surgery. Patient will be in the ED until further notice from the Or.

## 2022-09-26 NOTE — Anesthesia Procedure Notes (Signed)
Procedure Name: Intubation Date/Time: 09/26/2022 10:26 PM  Performed by: Ailis Rigaud T, CRNAPre-anesthesia Checklist: Patient identified, Emergency Drugs available, Suction available and Patient being monitored Patient Re-evaluated:Patient Re-evaluated prior to induction Oxygen Delivery Method: Circle system utilized Preoxygenation: Pre-oxygenation with 100% oxygen Induction Type: IV induction, Rapid sequence and Cricoid Pressure applied Ventilation: Mask ventilation without difficulty Laryngoscope Size: Miller and 3 Grade View: Grade II Tube type: Oral Tube size: 7.5 mm Number of attempts: 1 Airway Equipment and Method: Stylet and Oral airway Placement Confirmation: ETT inserted through vocal cords under direct vision, positive ETCO2 and breath sounds checked- equal and bilateral Secured at: 22 cm Tube secured with: Tape Dental Injury: Teeth and Oropharynx as per pre-operative assessment

## 2022-09-26 NOTE — Op Note (Signed)
09/26/2022  11:34 PM  PATIENT:  Jay Weaver    PRE-OPERATIVE DIAGNOSIS: Left humerus chronic osteomyelitis  POST-OPERATIVE DIAGNOSIS:  Same  PROCEDURE:  IRRIGATION AND DEBRIDEMENT LEFT humerus with bone biopsy  SURGEON:  Damauri Minion A Namir Neto, MD  PHYSICIAN ASSISTANT: None  ANESTHESIA:   General  PREOPERATIVE INDICATIONS:  Jay Weaver is a  36 y.o. male with a diagnosis of Left elbow infection who failed conservative measures and elected for surgical management.    The risks benefits and alternatives were discussed with the patient preoperatively including but not limited to the risks of infection, bleeding, nerve injury, cardiopulmonary complications, the need for revision surgery, among others, and the patient was willing to proceed.  ESTIMATED BLOOD LOSS: 25cc  OPERATIVE IMPLANTS: none  OPERATIVE FINDINGS: Purulent abscess in the left humeral bone sent for culture and pathology  OPERATIVE PROCEDURE:  Patient was brought to the operating room.  General anesthesia was induced.  The patient was positioned lateral on the beanbag.  The left upper extremity was prepped and draped in sterile fashion.  Antibiotics were held until cultures were obtained.  Timeout was performed.  A longitudinal incision was made over the posterior aspect of the distal humerus curving radially around the olecranon.  Triceps fascia was identified and split longitudinally.  Tricep muscle was split down to the posterior humerus.  No purulence or abnormal tissue was encountered in either the soft tissues or elbow.  Using an egg burr a cortical window was drilled into the posterior cortex of the humerus.  The posterior cortex was notably very sclerotic.  Upon opening the posterior cortex purulence was expressed from the medullary canal.  Culture swab as well as tissue from the canal was sent for aerobic anaerobic culture.  Cortical bone and tissue was also sent to pathology.  Vancomycin and cefepime  antibiotics were started at this point since cultures have been obtained.  The canal was thoroughly debrided with a curette and rongeur.   The canal and distal humerus was thoroughly irrigated with 3 L of normal saline.  Following irrigation 500 mg of vancomycin powder was introduced into the bone and posterior soft tissues.  The incision was closed in layered fashion with Monocryl and nylon.  Sterile dressing was applied with Adaptic, 4 x 4 gauze, ABD, cast padding, Ace wrap.  The patient was awoken from anesthesia and transferred to the PACU in stable condition.   Debridement type: Excisional Debridement   Side: Left   Body Location: distal humerus   Tools used for debridement: scalpel, scissors, cobb, currette, and rongeur   Pre-debridement Wound size (cm):   Length: 5        Width: 3     Depth: 3    Post-debridement Wound size (cm):   Length: 5        Width: 3    Depth: 3   Debridement depth beyond dead/damaged tissue down to healthy viable tissue: yes   Tissue layer involved: skin, subcutaneous tissue, muscle, bone   Nature of tissue removed: Devitalized Tissue   Irrigation volume: 3 L      Irrigation fluid type: Normal Saline  Post op recs: WB: WBAT LUE Abx: Started on Vancomycin and cefepime once Intra-Op cultures were obtained.  Dressing: keep intact until follow up, change PRN if soiled or saturated. DVT prophylaxis: lovenox in house, discharge on aspirin 81 mg twice daily Follow up: 2 weeks after surgery for a wound check with Dr. Blanchie Dessert at [facility]  Address: [address], [place], [place] [address]  Office Phone: [phone]  Weber Cooks, MD Orthopaedic Surgery
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
## 2022-09-26 NOTE — ED Triage Notes (Signed)
Patient sent to ED by orthopedist for further evaluation and treatment of osteomyelitis of left arm.

## 2022-09-26 NOTE — ED Provider Triage Note (Addendum)
Emergency Medicine Provider Triage Evaluation Note  Jay Weaver , a 36 y.o. male  was evaluated in triage.  Pt complains of left elbow pain concern for osteomyelitis.  Patient previously noted to have chronic osteomyelitis of the left elbow.  Today he reportedly went to his orthopedist, and was sent here for hospital Ortho eval.  He notes that he has been taking his medication as directed, but has nausea, vomiting which is making it somewhat difficult.  Minimal relief with Motrin.  Review of Systems  Positive: Elbow pain Negative: Loss of sensation distally, fever  Physical Exam  BP (!) 155/99   Pulse (!) 106   Temp 99.1 F (37.3 C)   Resp 16   SpO2 91%  Gen:   Awake, no distress speaking clearly Resp:  Normal effort no increased work of breathing MSK:   Moves extremities without difficulty substantial scar burden throughout the left arm without ability to fully extend, but with the patient moving it spontaneously, clearly. Other:  Skin unremarkable aside from scars  Medical Decision Making  Medically screening exam initiated at 2:36 PM.  Appropriate orders placed.  Jay Weaver was informed that the remainder of the evaluation will be completed by another provider, this initial triage assessment does not replace that evaluation, and the importance of remaining in the ED until their evaluation is complete.   Gerhard Munch, MD 09/26/22 1437  I spoke with the patient's orthopedist Dr. Eulah Pont.  He indicates that he, Dr. Blanchie Dessert and Dr. Lajoyce Corners will coordinate care regarding the patient's osteo-, abscess.  No indication for IV antibiotics until that procedure has been performed and there is a tissue sample from the washout available to guide therapy.  Patient should be admitted by the hospitalist team, with anticipated and Ortho care.    Gerhard Munch, MD 09/26/22 [phone]

## 2022-09-26 NOTE — Progress Notes (Signed)
Pt up tp void, informed of POC for surgical procedure, transport at bedside to take pt to OR
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
## 2022-09-26 NOTE — Progress Notes (Signed)
Pt arrived to unit, informed of POC, all needs addressed 
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
## 2022-09-26 NOTE — Anesthesia Preprocedure Evaluation (Addendum)
Anesthesia Evaluation  Patient identified by MRN, date of birth, ID band Patient awake    Reviewed: Allergy & Precautions, NPO status , Patient's Chart, lab work & pertinent test results  Airway Mallampati: II  TM Distance: >3 FB Neck ROM: Full    Dental  (+) Partial Upper   Pulmonary Current SmokerPatient did not abstain from smoking.   Pulmonary exam normal        Cardiovascular negative cardio ROS  Rhythm:Regular Rate:Normal     Neuro/Psych   Anxiety     negative neurological ROS     GI/Hepatic negative GI ROS, Neg liver ROS,,,  Endo/Other  negative endocrine ROS    Renal/GU   negative genitourinary   Musculoskeletal Left elbow infection   Abdominal Normal abdominal exam  (+)   Peds  Hematology  (+) Blood dyscrasia, anemia   Anesthesia Other Findings   Reproductive/Obstetrics                             Anesthesia Physical Anesthesia Plan  ASA: 2  Anesthesia Plan: General   Post-op Pain Management:    Induction: Intravenous  PONV Risk Score and Plan: 1 and Ondansetron, Dexamethasone, Midazolam and Treatment may vary due to age or medical condition  Airway Management Planned: Mask and Oral ETT  Additional Equipment: None  Intra-op Plan:   Post-operative Plan: Extubation in OR  Informed Consent: I have reviewed the patients History and Physical, chart, labs and discussed the procedure including the risks, benefits and alternatives for the proposed anesthesia with the patient or authorized representative who has indicated his/her understanding and acceptance.     Dental advisory given  Plan Discussed with: CRNA  Anesthesia Plan Comments: (Lab Results      Component                Value               Date                      WBC                      8.8                 09/26/2022                HGB                      12.2 (L)            09/26/2022                HCT                       35.0 (L)            09/26/2022                MCV                      89.1                09/26/2022                PLT                      328  09/26/2022            Lab Results      Component                Value               Date                      NA                       140                 09/26/2022                K                        3.5                 09/26/2022                CO2                      18 (L)              09/26/2022                GLUCOSE                  144 (H)             09/26/2022                BUN                      16                  09/26/2022                CREATININE               1.94 (H)            09/26/2022                CALCIUM                  9.1                 09/26/2022                GFRNONAA                 46 (L)              09/26/2022           )       Anesthesia Quick Evaluation

## 2022-09-27 ENCOUNTER — Encounter (HOSPITAL_COMMUNITY): Payer: Self-pay | Admitting: Orthopedic Surgery

## 2022-09-27 DIAGNOSIS — M86122 Other acute osteomyelitis, left humerus: Principal | ICD-10-CM

## 2022-09-27 LAB — CBC
HCT: 32.8 % — ABNORMAL LOW (ref 39.0–52.0)
Hemoglobin: 11.6 g/dL — ABNORMAL LOW (ref 13.0–17.0)
MCH: 30.5 pg (ref 26.0–34.0)
MCHC: 35.4 g/dL (ref 30.0–36.0)
MCV: 86.3 fL (ref 80.0–100.0)
Platelets: 321 10*3/uL (ref 150–400)
RBC: 3.8 MIL/uL — ABNORMAL LOW (ref 4.22–5.81)
RDW: 14.4 % (ref 11.5–15.5)
WBC: 10.5 10*3/uL (ref 4.0–10.5)
nRBC: 0 % (ref 0.0–0.2)

## 2022-09-27 LAB — BASIC METABOLIC PANEL
Anion gap: 9 (ref 5–15)
BUN: 9 mg/dL (ref 6–20)
CO2: 22 mmol/L (ref 22–32)
Calcium: 9 mg/dL (ref 8.9–10.3)
Chloride: 106 mmol/L (ref 98–111)
Creatinine, Ser: 1.37 mg/dL — ABNORMAL HIGH (ref 0.61–1.24)
GFR, Estimated: >60 mL/min (ref >60)
Glucose, Bld: 105 mg/dL — ABNORMAL HIGH (ref 70–99)
Potassium: 3.5 mmol/L (ref 3.5–5.1)
Sodium: 137 mmol/L (ref 135–145)

## 2022-09-27 LAB — GLUCOSE, CAPILLARY: Glucose-Capillary: 115 mg/dL — ABNORMAL HIGH (ref 70–99)

## 2022-09-27 MED ORDER — OXYCODONE HCL 5 MG PO TABS
5.0000 mg | ORAL_TABLET | ORAL | Status: DC | PRN
  Administered 2022-09-27 – 2022-09-29 (×11): 10 mg via ORAL
  Filled 2022-09-27 (×11): qty 2

## 2022-09-27 MED ORDER — MIDAZOLAM HCL 2 MG/2ML IJ SOLN
INTRAMUSCULAR | Status: AC
  Filled 2022-09-27: qty 2

## 2022-09-27 MED ORDER — SODIUM CHLORIDE 0.9 % IV SOLN
2.0000 g | Freq: Two times a day (BID) | INTRAVENOUS | Status: DC
  Administered 2022-09-27: 2 g via INTRAVENOUS
  Filled 2022-09-27: qty 12.5

## 2022-09-27 MED ORDER — SODIUM CHLORIDE 0.9 % IV SOLN
2.0000 g | Freq: Three times a day (TID) | INTRAVENOUS | Status: DC
  Administered 2022-09-27 – 2022-09-29 (×6): 2 g via INTRAVENOUS
  Filled 2022-09-27 (×6): qty 12.5

## 2022-09-27 MED ORDER — MIDAZOLAM HCL 2 MG/2ML IJ SOLN
2.0000 mg | Freq: Once | INTRAMUSCULAR | Status: AC
  Administered 2022-09-27: 2 mg via INTRAVENOUS

## 2022-09-27 MED ORDER — HYDROMORPHONE HCL 1 MG/ML IJ SOLN
1.0000 mg | INTRAMUSCULAR | Status: DC | PRN
  Administered 2022-09-27 – 2022-09-29 (×14): 1 mg via INTRAVENOUS
  Filled 2022-09-27 (×15): qty 1

## 2022-09-27 MED ORDER — METHOCARBAMOL 500 MG PO TABS
500.0000 mg | ORAL_TABLET | Freq: Four times a day (QID) | ORAL | Status: DC | PRN
  Administered 2022-09-27 – 2022-09-28 (×4): 500 mg via ORAL
  Filled 2022-09-27 (×4): qty 1

## 2022-09-27 MED ORDER — HYDROMORPHONE HCL 1 MG/ML IJ SOLN
0.5000 mg | INTRAMUSCULAR | Status: AC
  Administered 2022-09-27: 0.5 mg via INTRAVENOUS
  Filled 2022-09-27: qty 0.5

## 2022-09-27 MED ORDER — SODIUM CHLORIDE 0.9 % IV SOLN: 2.0000 g | INTRAVENOUS | Status: DC

## 2022-09-27 MED ORDER — VANCOMYCIN HCL 1250 MG/250ML IV SOLN
1250.0000 mg | INTRAVENOUS | Status: DC
  Administered 2022-09-28 – 2022-09-29 (×2): 1250 mg via INTRAVENOUS
  Filled 2022-09-27 (×2): qty 250

## 2022-09-27 MED ORDER — VANCOMYCIN HCL IN DEXTROSE 1-5 GM/200ML-% IV SOLN
1000.0000 mg | Freq: Once | INTRAVENOUS | Status: AC
  Administered 2022-09-27: 1000 mg via INTRAVENOUS
  Filled 2022-09-27: qty 200

## 2022-09-27 MED ORDER — ACETAMINOPHEN 500 MG PO TABS
1000.0000 mg | ORAL_TABLET | Freq: Three times a day (TID) | ORAL | Status: DC
  Administered 2022-09-27 – 2022-09-28 (×2): 1000 mg via ORAL
  Filled 2022-09-27 (×7): qty 2

## 2022-09-27 MED ORDER — VANCOMYCIN VARIABLE DOSE PER UNSTABLE RENAL FUNCTION (PHARMACIST DOSING)
Status: DC
  Filled 2022-09-27: qty 1
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
## 2022-09-27 NOTE — Transfer of Care (Signed)
Immediate Anesthesia Transfer of Care Note  Patient: Jay Weaver  Procedure(s) Performed: IRRIGATION AND DEBRIDEMENT LEFT ELBOW  AND BONE BIOPSY (Left: Elbow)  Patient Location: PACU  Anesthesia Type:General  Level of Consciousness: awake and pateint uncooperative  Airway & Oxygen Therapy: Patient Spontanous Breathing and Patient connected to nasal cannula oxygen  Post-op Assessment: Report given to RN, Post -op Vital signs reviewed and stable, and Patient moving all extremities  Post vital signs: Reviewed and stable  Last Vitals:  Vitals Value Taken Time  BP 115/93 09/27/22 0000  Temp 36.7 C 09/26/22 2353  Pulse 90 09/27/22 0005  Resp 20 09/27/22 0005  SpO2 95 % 09/27/22 0005  Vitals shown include unvalidated device data.  Last Pain:  Vitals:   09/26/22 1940  TempSrc:   PainSc: 4          Complications: No notable events documented.

## 2022-09-27 NOTE — Progress Notes (Signed)
Pharmacy Antibiotic Note  Jay Weaver is a 36 y.o. male admitted on 09/26/2022 with  osteomyelitis with abscess, now s/p I&D .  Pharmacy has been consulted for vancomycin and cefepime dosing.  Pt w/ AKI; baseline SCr 1-1.2, now 1.94; labs have not been drawn since IVF bolus.  Plan: Vancomycin 1000mg  given perioperatively as well as 500mg  of vanc powder applied to site of abscess in OR; will give another 1g to complete 2g load then monitor SCr +/- vanc levels prior to redosing. Cefepime 1g given perioperatively; continue 2g IV Q12H.  Temp (24hrs), Avg:98.2 F (36.8 C), Min:97.7 F (36.5 C), Max:99.1 F (37.3 C)  Recent Labs  Lab 09/22/22 1238 09/26/22 1448 09/26/22 1708 09/26/22 2042  WBC 8.8 8.8  --   --   CREATININE 1.08 1.94*  --   --   LATICACIDVEN 0.7  --  0.7 0.7    Estimated Creatinine Clearance: 55.5 mL/min (A) (by C-G formula based on SCr of 1.94 mg/dL (H)).    Allergies  Allergen Reactions   Norco [Hydrocodone-Acetaminophen] Nausea And Vomiting   Penicillins Hives    Did it involve swelling of the face/tongue/throat, SOB, or low BP? No Did it involve sudden or severe rash/hives, skin peeling, or any reaction on the inside of your mouth or nose? No Did you need to seek medical attention at a hospital or doctor's office? No When did it last happen?    2011   If all above answers are "NO", may proceed with cephalosporin use.      Thank you for allowing pharmacy to be a part of this patient's care.  2043, PharmD, BCPS  09/27/2022 1:09 AM

## 2022-09-27 NOTE — Anesthesia Postprocedure Evaluation (Signed)
Anesthesia Post Note  Patient: Jay Weaver  Procedure(s) Performed: IRRIGATION AND DEBRIDEMENT LEFT ELBOW  AND BONE BIOPSY (Left: Elbow)     Patient location during evaluation: PACU Anesthesia Type: General Level of consciousness: awake and alert Pain management: pain level controlled Vital Signs Assessment: post-procedure vital signs reviewed and stable Respiratory status: spontaneous breathing, nonlabored ventilation, respiratory function stable and patient connected to nasal cannula oxygen Cardiovascular status: blood pressure returned to baseline and stable Postop Assessment: no apparent nausea or vomiting Anesthetic complications: no   No notable events documented.  Last Vitals:  Vitals:   09/27/22 0015 09/27/22 0020  BP: (!) 150/94 (!) 150/94  Pulse:  86  Resp:  18  Temp: 36.8 C   SpO2:  94%    Last Pain:  Vitals:   09/27/22 0048  TempSrc:   PainSc: 7                  Juanitta Earnhardt P Lakyra Tippins

## 2022-09-27 NOTE — Progress Notes (Signed)
Pt arrived to unit from pacu at this time, heavily confused, trying to get out of bed, bed alarm placed on

## 2022-09-27 NOTE — Progress Notes (Signed)
Pharmacy Antibiotic Note  Jay Weaver is a 36 y.o. male admitted on 09/26/2022 with  osteomyelitis with abscess, now s/p I&D .  Pharmacy has been consulted for vancomycin and cefepime dosing.  Pt w/ AKI; baseline SCr 1-1.2, now 1.94 on admission; labs have not been drawn since IVF bolus. Plan for ID consult when culture data returns.   Vancomycin 1000mg  given perioperatively (11/22) as well as 500mg  of vanc powder applied to site of abscess in OR. Was given an additional 1g this AM (11/23) to complete the 2g load. Scr improved to 1.37 close to baseline. Wbc 10.5 and afebrile. 2nd half of bolus given this AM.     Plan: Vancomycin 1250 mg IV q24h; estimated AUC 465 Increase cefepime to 2g IV q8h Monitor renal function, culture results, clinical status and resolution of s/sx infection Obtain vancomycin levels as necessary  Narrow abx as able and f/u with appropriate duration  Temp (24hrs), Avg:98.3 F (36.8 C), Min:97.7 F (36.5 C), Max:99.1 F (37.3 C)  Recent Labs  Lab 09/22/22 1238 09/26/22 1448 09/26/22 1708 09/26/22 2042 09/27/22 0853  WBC 8.8 8.8  --   --  10.5  CREATININE 1.08 1.94*  --   --  1.37*  LATICACIDVEN 0.7  --  0.7 0.7  --      Estimated Creatinine Clearance: 78.7 mL/min (A) (by C-G formula based on SCr of 1.37 mg/dL (H)).    Allergies  Allergen Reactions   Norco [Hydrocodone-Acetaminophen] Nausea And Vomiting   Penicillins Hives    Did it involve swelling of the face/tongue/throat, SOB, or low BP? No Did it involve sudden or severe rash/hives, skin peeling, or any reaction on the inside of your mouth or nose? No Did you need to seek medical attention at a hospital or doctor's office? No When did it last happen?    2011   If all above answers are "NO", may proceed with cephalosporin use.    Microbiology Results 11/23 Bone biopsy: gram positive cocci in pairs and in singles   Thank you for allowing pharmacy to be a part of this patient's  care.   2012, PharmD PGY1 Pharmacy Resident   09/27/2022 11:15 AM
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
## 2022-09-28 LAB — CBC
HCT: 33 % — ABNORMAL LOW (ref 39.0–52.0)
Hemoglobin: 11.6 g/dL — ABNORMAL LOW (ref 13.0–17.0)
MCH: 30.4 pg (ref 26.0–34.0)
MCHC: 35.2 g/dL (ref 30.0–36.0)
MCV: 86.6 fL (ref 80.0–100.0)
Platelets: 296 10*3/uL (ref 150–400)
RBC: 3.81 MIL/uL — ABNORMAL LOW (ref 4.22–5.81)
RDW: 13.8 % (ref 11.5–15.5)
WBC: 9.6 10*3/uL (ref 4.0–10.5)
nRBC: 0 % (ref 0.0–0.2)

## 2022-09-28 LAB — BASIC METABOLIC PANEL
Anion gap: 13 (ref 5–15)
BUN: 7 mg/dL (ref 6–20)
CO2: 20 mmol/L — ABNORMAL LOW (ref 22–32)
Calcium: 8.6 mg/dL — ABNORMAL LOW (ref 8.9–10.3)
Chloride: 100 mmol/L (ref 98–111)
Creatinine, Ser: 1.38 mg/dL — ABNORMAL HIGH (ref 0.61–1.24)
GFR, Estimated: >60 mL/min (ref >60)
Glucose, Bld: 110 mg/dL — ABNORMAL HIGH (ref 70–99)
Potassium: 2.9 mmol/L — ABNORMAL LOW (ref 3.5–5.1)
Sodium: 133 mmol/L — ABNORMAL LOW (ref 135–145)

## 2022-09-28 LAB — MAGNESIUM: Magnesium: 1.4 mg/dL — ABNORMAL LOW (ref 1.7–2.4)

## 2022-09-28 MED ORDER — MAGNESIUM SULFATE 2 GM/50ML IV SOLN
2.0000 g | Freq: Once | INTRAVENOUS | Status: AC
  Administered 2022-09-28: 2 g via INTRAVENOUS
  Filled 2022-09-28: qty 50

## 2022-09-28 MED ORDER — POTASSIUM CHLORIDE CRYS ER 20 MEQ PO TBCR
30.0000 meq | EXTENDED_RELEASE_TABLET | ORAL | Status: AC
  Administered 2022-09-28 (×3): 30 meq via ORAL
  Filled 2022-09-28 (×3): qty 2

## 2022-09-28 NOTE — Tx Team (Signed)
Pt called out and request to speak to the charge nurse. He is upset regarding his pain medication and what he was just given. He has been resistant to care and reassessment of his pain medication. Reviewed pain medication administration times and next time dosages due. Reinforced that all medications are scanned and verified before they are administered. Reminded the patient that our reassessment questions regarding pain are necessary to better understand his pain and treat it properly. He verbalized understanding.
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
## 2022-09-28 NOTE — Progress Notes (Signed)
Patient voiced that he is feeling better and pain is better controlled. Observation continues.
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
## 2022-09-28 NOTE — Progress Notes (Signed)
Pt called to have his IVF restarted after a new IV was inserted by IV Team, this nurse went to patient's room to restart IV fluids and to notify patient what he can get for pain. Patient stated that he had called 5 times requesting for pain meds, patient was notified that message was relayed to me one time but was dealing with an emergency situation in another room and that his pain meds were not due at the time. Patient lashed out at me calling me a liar and that he needed to talk to a supervisor. Charge RN was called into patient's to deescalate the situation but patient started starting yelling demanding for his pain meds. Patient was assigned another nurse for the rest of the shift.

## 2022-09-28 NOTE — Clinical Note (Incomplete)
Patient has been resting in bed. Lab came in to draw labs. Pt  awakes c/o of a headache and dizziness. V/S done,see  flowsheet. Offered pain medication refused same. Bed alarm on. Observation  continue.

## 2022-09-29 DIAGNOSIS — M86022 Acute hematogenous osteomyelitis, left humerus: Secondary | ICD-10-CM

## 2022-09-29 LAB — BASIC METABOLIC PANEL
Anion gap: 12 (ref 5–15)
BUN: 8 mg/dL (ref 6–20)
CO2: 23 mmol/L (ref 22–32)
Calcium: 9.3 mg/dL (ref 8.9–10.3)
Chloride: 102 mmol/L (ref 98–111)
Creatinine, Ser: 1.23 mg/dL (ref 0.61–1.24)
GFR, Estimated: >60 mL/min (ref >60)
Glucose, Bld: 109 mg/dL — ABNORMAL HIGH (ref 70–99)
Potassium: 3.5 mmol/L (ref 3.5–5.1)
Sodium: 137 mmol/L (ref 135–145)

## 2022-09-29 LAB — C-REACTIVE PROTEIN: CRP: 15.7 mg/dL — ABNORMAL HIGH (ref <1.0)

## 2022-09-29 LAB — MAGNESIUM: Magnesium: 1.9 mg/dL (ref 1.7–2.4)

## 2022-09-29 LAB — SEDIMENTATION RATE: Sed Rate: 67 mm/hr — ABNORMAL HIGH (ref 0–16)

## 2022-09-29 MED ORDER — CEFAZOLIN SODIUM-DEXTROSE 2-4 GM/100ML-% IV SOLN
2.0000 g | Freq: Three times a day (TID) | INTRAVENOUS | Status: DC
  Administered 2022-09-29: 2 g via INTRAVENOUS
  Filled 2022-09-29: qty 100

## 2022-09-29 MED ORDER — ASPIRIN 81 MG PO TBEC: 81.0000 mg | DELAYED_RELEASE_TABLET | Freq: Two times a day (BID) | ORAL | 0 refills | Status: AC

## 2022-09-29 MED ORDER — CEFADROXIL 500 MG PO CAPS: 1000.0000 mg | ORAL_CAPSULE | Freq: Two times a day (BID) | ORAL | 0 refills | Status: AC

## 2022-09-29 MED ORDER — POTASSIUM CHLORIDE CRYS ER 20 MEQ PO TBCR
40.0000 meq | EXTENDED_RELEASE_TABLET | Freq: Once | ORAL | Status: AC
  Administered 2022-09-29: 40 meq via ORAL
  Filled 2022-09-29: qty 2

## 2022-09-29 MED ORDER — OXYCODONE HCL 5 MG PO TABS: 5.0000 mg | ORAL_TABLET | Freq: Four times a day (QID) | ORAL | 0 refills | Status: AC | PRN

## 2022-09-29 MED ORDER — LINEZOLID 600 MG PO TABS: 600.0000 mg | ORAL_TABLET | Freq: Two times a day (BID) | ORAL | 0 refills | Status: AC

## 2022-09-29 MED ORDER — OXYCODONE HCL 5 MG PO TABS
10.0000 mg | ORAL_TABLET | ORAL | Status: DC | PRN
  Administered 2022-09-29 (×2): 15 mg via ORAL
  Filled 2022-09-29 (×2): qty 3

## 2022-09-29 NOTE — Progress Notes (Signed)
Pharmacy Antibiotic Note  Jay Weaver is a 36 y.o. male admitted on 09/26/2022 with  osteomyelitis with abscess, now s/p I&D .  Pharmacy originally consulted for vancomycin and cefepime dosing. ID consulted and now recommending cefazolin. Pharmacy consulted to dose cefazolin.   Left humerus culture from bone biopsy showing abundant pansensitive MSSA. Afebrile with wbc wnl. Clinically stable - ok to de-escalate antibiotics.    Plan: Start cefazolin 2g IV q8h Stop cefepime  Stop vancomycin   Monitor renal function, clinical status and resolution of s/sx infection F/u with appropriate duration F/u ID recommendations   Temp (24hrs), Avg:98.6 F (37 C), Min:98.4 F (36.9 C), Max:98.7 F (37.1 C)  Recent Labs  Lab 09/22/22 1238 09/26/22 1448 09/26/22 1708 09/26/22 2042 09/27/22 0853 09/28/22 0626 09/29/22 0351  WBC 8.8 8.8  --   --  10.5 9.6  --   CREATININE 1.08 1.94*  --   --  1.37* 1.38* 1.23  LATICACIDVEN 0.7  --  0.7 0.7  --   --   --     Estimated Creatinine Clearance: 87.6 mL/min (by C-G formula based on SCr of 1.23 mg/dL).    Allergies  Allergen Reactions   Norco [Hydrocodone-Acetaminophen] Nausea And Vomiting   Penicillins Hives    Did it involve swelling of the face/tongue/throat, SOB, or low BP? No Did it involve sudden or severe rash/hives, skin peeling, or any reaction on the inside of your mouth or nose? No Did you need to seek medical attention at a hospital or doctor's office? No When did it last happen?    2011   If all above answers are "NO", may proceed with cephalosporin use.    Microbiology Results 11/23 Left humerus Cx: pansensitive MSSA  11/22 Bcx: NGTD x3   Thank you for allowing pharmacy to be a part of this patient's care.   Jerry Caras, PharmD PGY1 Pharmacy Resident   09/29/2022 11:10 AM

## 2022-09-29 NOTE — Discharge Instructions (Signed)
Dressing: keep intact until follow up with orthopedics
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
## 2022-09-29 NOTE — Plan of Care (Signed)
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
## 2022-10-01 LAB — CULTURE, BLOOD (ROUTINE X 2)
Culture: NO GROWTH
Culture: NO GROWTH
Special Requests: ADEQUATE

## 2022-10-01 LAB — SURGICAL PATHOLOGY

## 2022-10-02 LAB — AEROBIC/ANAEROBIC CULTURE W GRAM STAIN (SURGICAL/DEEP WOUND)

## 2022-10-05 DIAGNOSIS — Z419 Encounter for procedure for purposes other than remedying health state, unspecified: Secondary | ICD-10-CM | POA: Diagnosis not present

## 2022-10-09 DIAGNOSIS — M869 Osteomyelitis, unspecified: Secondary | ICD-10-CM | POA: Diagnosis not present

## 2022-10-09 DIAGNOSIS — M25522 Pain in left elbow: Secondary | ICD-10-CM | POA: Diagnosis not present

## 2022-10-11 ENCOUNTER — Inpatient Hospital Stay: Payer: Medicaid Other | Admitting: Internal Medicine

## 2022-10-12 ENCOUNTER — Telehealth: Payer: Self-pay

## 2022-10-12 ENCOUNTER — Inpatient Hospital Stay: Payer: Medicaid Other | Admitting: Internal Medicine

## 2022-10-12 NOTE — Telephone Encounter (Signed)
Called patient to see if he would be able to make it to today's appointment, no answer. Left HIPAA compliant voicemail requesting callback.   Jennifer Payes D Oluwatobi Ruppe, RN  

## 2022-10-18 ENCOUNTER — Other Ambulatory Visit: Payer: Self-pay

## 2022-10-18 ENCOUNTER — Ambulatory Visit (INDEPENDENT_AMBULATORY_CARE_PROVIDER_SITE_OTHER): Payer: Medicaid Other | Admitting: Internal Medicine

## 2022-10-18 ENCOUNTER — Encounter: Payer: Self-pay | Admitting: Internal Medicine

## 2022-10-18 VITALS — Wt 194.0 lb

## 2022-10-18 DIAGNOSIS — M869 Osteomyelitis, unspecified: Secondary | ICD-10-CM
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
## 2022-10-18 NOTE — Patient Instructions (Signed)
Labs today  Please follow up with your orthopedics surgeon as well  If redness/pus/fever chill let us know   Make sure you finish your antibiotics course of 4 more weeks with cefadroxil 1000 mg twice a day   See me again in around

## 2022-10-19 LAB — COMPLETE METABOLIC PANEL WITH GFR
AG Ratio: 1.4 (calc) (ref 1.0–2.5)
ALT: 20 U/L (ref 9–46)
AST: 17 U/L (ref 10–40)
Albumin: 4.3 g/dL (ref 3.6–5.1)
Alkaline phosphatase (APISO): 82 U/L (ref 36–130)
BUN: 11 mg/dL (ref 7–25)
CO2: 23 mmol/L (ref 20–32)
Calcium: 9.5 mg/dL (ref 8.6–10.3)
Chloride: 107 mmol/L (ref 98–110)
Creat: 1.2 mg/dL (ref 0.60–1.26)
Globulin: 3.1 g/dL (calc) (ref 1.9–3.7)
Glucose, Bld: 92 mg/dL (ref 65–99)
Potassium: 4.3 mmol/L (ref 3.5–5.3)
Sodium: 139 mmol/L (ref 135–146)
Total Bilirubin: 0.4 mg/dL (ref 0.2–1.2)
Total Protein: 7.4 g/dL (ref 6.1–8.1)
eGFR: 81 mL/min/{1.73_m2} (ref >=60)

## 2022-10-19 LAB — CBC
HCT: 35.9 % — ABNORMAL LOW (ref 38.5–50.0)
Hemoglobin: 12.3 g/dL — ABNORMAL LOW (ref 13.2–17.1)
MCH: 30.6 pg (ref 27.0–33.0)
MCHC: 34.3 g/dL (ref 32.0–36.0)
MCV: 89.3 fL (ref 80.0–100.0)
MPV: 9.7 fL (ref 7.5–12.5)
Platelets: 300 10*3/uL (ref 140–400)
RBC: 4.02 10*6/uL — ABNORMAL LOW (ref 4.20–5.80)
RDW: 14 % (ref 11.0–15.0)
WBC: 5.5 10*3/uL (ref 3.8–10.8)

## 2022-10-19 LAB — C-REACTIVE PROTEIN: CRP: 2.7 mg/L (ref <8.0)

## 2022-11-05 DIAGNOSIS — Z419 Encounter for procedure for purposes other than remedying health state, unspecified: Secondary | ICD-10-CM | POA: Diagnosis not present

## 2022-11-07 ENCOUNTER — Ambulatory Visit: Payer: Medicaid Other | Admitting: Internal Medicine

## 2022-12-06 DIAGNOSIS — Z419 Encounter for procedure for purposes other than remedying health state, unspecified: Secondary | ICD-10-CM | POA: Diagnosis not present

## 2023-01-04 DIAGNOSIS — Z419 Encounter for procedure for purposes other than remedying health state, unspecified: Secondary | ICD-10-CM | POA: Diagnosis not present

## 2023-01-15 ENCOUNTER — Telehealth: Payer: Self-pay

## 2023-01-15 NOTE — Telephone Encounter (Signed)
..   Medicaid Managed Care   Unsuccessful Outreach Note  01/15/2023 Name: Jay Weaver MRN: [id number] DOB: Feb 20, 1986  Referred by: Patient, No Pcp Per Reason for referral : Appointment (I called the patient today to offer the services of the Managed Medicaid Team. Patients number was not in order at the time of the call.)   An unsuccessful telephone outreach was attempted today. The patient was referred to the case management team for assistance with care management and care coordination.   Follow Up Plan: The care management team will reach out to the patient again over the next 7 days.    [place]

## 2023-01-24 ENCOUNTER — Telehealth: Payer: Self-pay

## 2023-01-24 NOTE — Telephone Encounter (Signed)
..   Medicaid Managed Care   Unsuccessful Outreach Note  01/24/2023 Name: Jay Weaver MRN: [id number] DOB: [birth year]  Referred by: Patient, No Pcp Per Reason for referral : Appointment   A second unsuccessful telephone outreach was attempted today. The patient was referred to the case management team for assistance with care management and care coordination.   Follow Up Plan: The care management team will reach out to the patient again over the next 14 days.   [place]

## 2023-02-04 DIAGNOSIS — Z419 Encounter for procedure for purposes other than remedying health state, unspecified: Secondary | ICD-10-CM | POA: Diagnosis not present

## 2023-02-06 ENCOUNTER — Telehealth: Payer: Self-pay

## 2023-02-06 NOTE — Progress Notes (Signed)
..   Medicaid Managed Care   Unsuccessful Outreach Note  02/06/2023 Name: Jay Weaver MRN: [id number] DOB: 1985-12-13  Referred by: Patient, No Pcp Per Reason for referral : Appointment   Third unsuccessful telephone outreach was attempted today. The patient was referred to the case management team for assistance with care management and care coordination. The patient's primary care provider has been notified of our unsuccessful attempts to make or maintain contact with the patient. The care management team is pleased to engage with this patient at any time in the future should he/she be interested in assistance from the care management team.   Follow Up Plan: We have been unable to make contact with the patient for follow up. The care management team is available to follow up with the patient after provider conversation with the patient regarding recommendation for care management engagement and subsequent re-referral to the care management team.   [place]  [phone]

## 2023-02-18 ENCOUNTER — Encounter: Payer: Self-pay | Admitting: *Deleted

## 2023-03-06 DIAGNOSIS — Z419 Encounter for procedure for purposes other than remedying health state, unspecified: Secondary | ICD-10-CM | POA: Diagnosis not present

## 2023-04-06 DIAGNOSIS — Z419 Encounter for procedure for purposes other than remedying health state, unspecified: Secondary | ICD-10-CM | POA: Diagnosis not present

## 2023-05-06 DIAGNOSIS — Z419 Encounter for procedure for purposes other than remedying health state, unspecified: Secondary | ICD-10-CM | POA: Diagnosis not present

## 2023-06-06 DIAGNOSIS — Z419 Encounter for procedure for purposes other than remedying health state, unspecified: Secondary | ICD-10-CM | POA: Diagnosis not present

## 2023-07-07 DIAGNOSIS — Z419 Encounter for procedure for purposes other than remedying health state, unspecified: Secondary | ICD-10-CM | POA: Diagnosis not present

## 2023-08-06 DIAGNOSIS — Z419 Encounter for procedure for purposes other than remedying health state, unspecified: Secondary | ICD-10-CM | POA: Diagnosis not present

## 2023-09-06 DIAGNOSIS — Z419 Encounter for procedure for purposes other than remedying health state, unspecified: Secondary | ICD-10-CM | POA: Diagnosis not present

## 2023-10-06 DIAGNOSIS — Z419 Encounter for procedure for purposes other than remedying health state, unspecified: Secondary | ICD-10-CM | POA: Diagnosis not present

## 2023-11-06 DIAGNOSIS — Z419 Encounter for procedure for purposes other than remedying health state, unspecified: Secondary | ICD-10-CM | POA: Diagnosis not present

## 2023-12-07 DIAGNOSIS — Z419 Encounter for procedure for purposes other than remedying health state, unspecified: Secondary | ICD-10-CM | POA: Diagnosis not present

## 2024-01-04 DIAGNOSIS — Z419 Encounter for procedure for purposes other than remedying health state, unspecified: Secondary | ICD-10-CM | POA: Diagnosis not present

## 2024-02-15 DIAGNOSIS — Z419 Encounter for procedure for purposes other than remedying health state, unspecified: Secondary | ICD-10-CM | POA: Diagnosis not present

## 2024-03-16 DIAGNOSIS — Z419 Encounter for procedure for purposes other than remedying health state, unspecified: Secondary | ICD-10-CM | POA: Diagnosis not present

## 2024-04-16 DIAGNOSIS — Z419 Encounter for procedure for purposes other than remedying health state, unspecified: Secondary | ICD-10-CM | POA: Diagnosis not present

## 2024-05-16 DIAGNOSIS — Z419 Encounter for procedure for purposes other than remedying health state, unspecified: Secondary | ICD-10-CM | POA: Diagnosis not present

## 2024-06-16 DIAGNOSIS — Z419 Encounter for procedure for purposes other than remedying health state, unspecified: Secondary | ICD-10-CM | POA: Diagnosis not present

## 2024-07-17 DIAGNOSIS — Z419 Encounter for procedure for purposes other than remedying health state, unspecified: Secondary | ICD-10-CM | POA: Diagnosis not present

## 2024-09-16 DIAGNOSIS — Z419 Encounter for procedure for purposes other than remedying health state, unspecified: Secondary | ICD-10-CM | POA: Diagnosis not present

## 2024-10-16 DIAGNOSIS — Z419 Encounter for procedure for purposes other than remedying health state, unspecified: Secondary | ICD-10-CM | POA: Diagnosis not present
